# Patient Record
Sex: Male | Born: 1965 | Race: White | Hispanic: No | Marital: Married | Smoking: Never smoker
Health system: Southern US, Community
[De-identification: ages and names within clinical notes are randomized; demographics above are authoritative.]

## PROBLEM LIST (undated history)

## (undated) DIAGNOSIS — Z8679 Personal history of other diseases of the circulatory system: Secondary | ICD-10-CM

## (undated) DIAGNOSIS — T7840XA Allergy, unspecified, initial encounter: Secondary | ICD-10-CM

## (undated) DIAGNOSIS — J302 Other seasonal allergic rhinitis: Secondary | ICD-10-CM

## (undated) DIAGNOSIS — E785 Hyperlipidemia, unspecified: Secondary | ICD-10-CM

## (undated) DIAGNOSIS — I10 Essential (primary) hypertension: Secondary | ICD-10-CM

## (undated) DIAGNOSIS — F419 Anxiety disorder, unspecified: Secondary | ICD-10-CM

## (undated) HISTORY — DX: Hyperlipidemia, unspecified: E78.5

## (undated) HISTORY — DX: Essential (primary) hypertension: I10

## (undated) HISTORY — PX: OTHER SURGICAL HISTORY: SHX169

## (undated) HISTORY — DX: Personal history of other diseases of the circulatory system: Z86.79

## (undated) HISTORY — DX: Allergy, unspecified, initial encounter: T78.40XA

## (undated) HISTORY — DX: Anxiety disorder, unspecified: F41.9

## (undated) HISTORY — DX: Other seasonal allergic rhinitis: J30.2

## (undated) HISTORY — PX: WISDOM TOOTH EXTRACTION: SHX21

---
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
## 2001-02-15 ENCOUNTER — Encounter: Payer: Self-pay | Admitting: Family Medicine

## 2001-02-15 ENCOUNTER — Ambulatory Visit (HOSPITAL_COMMUNITY): Admission: RE | Admit: 2001-02-15 | Discharge: 2001-02-15 | Payer: Self-pay | Admitting: Family Medicine

## 2004-02-07 ENCOUNTER — Ambulatory Visit (HOSPITAL_COMMUNITY): Admission: RE | Admit: 2004-02-07 | Discharge: 2004-02-07 | Payer: Self-pay | Admitting: Family Medicine

## 2010-06-28 ENCOUNTER — Encounter: Payer: Self-pay | Admitting: Nurse Practitioner

## 2010-06-28 DIAGNOSIS — I10 Essential (primary) hypertension: Secondary | ICD-10-CM

## 2012-06-22 ENCOUNTER — Encounter: Payer: Self-pay | Admitting: *Deleted

## 2012-06-22 NOTE — Telephone Encounter (Signed)
This encounter was created in error - please disregard.

## 2012-06-23 ENCOUNTER — Other Ambulatory Visit: Payer: Self-pay | Admitting: Nurse Practitioner

## 2012-06-23 ENCOUNTER — Other Ambulatory Visit: Payer: Self-pay | Admitting: *Deleted

## 2012-06-23 DIAGNOSIS — E785 Hyperlipidemia, unspecified: Secondary | ICD-10-CM

## 2012-06-23 MED ORDER — ATORVASTATIN CALCIUM 20 MG PO TABS: 20.0000 mg | ORAL_TABLET | Freq: Every day | ORAL | Status: DC

## 2012-12-30 ENCOUNTER — Other Ambulatory Visit: Payer: Self-pay

## 2012-12-30 MED ORDER — VALSARTAN-HYDROCHLOROTHIAZIDE 160-12.5 MG PO TABS: 1.0000 | ORAL_TABLET | Freq: Every day | ORAL | Status: DC

## 2012-12-30 MED ORDER — METOPROLOL SUCCINATE ER 200 MG PO TB24: 200.0000 mg | ORAL_TABLET | Freq: Every day | ORAL | Status: DC

## 2012-12-30 NOTE — Telephone Encounter (Signed)
Last seen 05/13/11  [facility]

## 2012-12-30 NOTE — Telephone Encounter (Signed)
Patient needs to be seen. Has exceeded time since last visit. Needs to bring all medications to next appointment. Last refill. 

## 2012-12-31 ENCOUNTER — Other Ambulatory Visit: Payer: Self-pay

## 2012-12-31 MED ORDER — METOPROLOL SUCCINATE ER 200 MG PO TB24: 200.0000 mg | ORAL_TABLET | Freq: Every day | ORAL | Status: DC

## 2012-12-31 MED ORDER — VALSARTAN-HYDROCHLOROTHIAZIDE 160-12.5 MG PO TABS: 1.0000 | ORAL_TABLET | Freq: Every day | ORAL | Status: DC

## 2012-12-31 NOTE — Telephone Encounter (Signed)
rx called to [facility] 

## 2013-03-22 ENCOUNTER — Encounter (INDEPENDENT_AMBULATORY_CARE_PROVIDER_SITE_OTHER): Payer: Self-pay

## 2013-03-22 ENCOUNTER — Ambulatory Visit (INDEPENDENT_AMBULATORY_CARE_PROVIDER_SITE_OTHER): Payer: BC Managed Care – PPO | Admitting: General Practice

## 2013-03-22 ENCOUNTER — Telehealth: Payer: Self-pay | Admitting: Family Medicine

## 2013-03-22 ENCOUNTER — Encounter: Payer: Self-pay | Admitting: General Practice

## 2013-03-22 VITALS — BP 153/90 | HR 66 | Temp 98.5°F | Ht 70.0 in | Wt 247.0 lb

## 2013-03-22 DIAGNOSIS — H9209 Otalgia, unspecified ear: Secondary | ICD-10-CM

## 2013-03-22 DIAGNOSIS — H9202 Otalgia, left ear: Secondary | ICD-10-CM

## 2013-03-22 MED ORDER — NEOMYCIN-POLYMYXIN-HC 3.5-10000-1 OT SOLN: 3.0000 [drp] | Freq: Three times a day (TID) | OTIC | Status: AC

## 2013-03-22 NOTE — Patient Instructions (Signed)
Otalgia °The most common reason for this in children is an infection of the middle ear. Pain from the middle ear is usually caused by a build-up of fluid and pressure behind the eardrum. Pain from an earache can be sharp, dull, or burning. The pain may be temporary or constant. The middle ear is connected to the nasal passages by a short narrow tube called the Eustachian tube. The Eustachian tube allows fluid to drain out of the middle ear, and helps keep the pressure in your ear equalized. °CAUSES  °A cold or allergy can block the Eustachian tube with inflammation and the build-up of secretions. This is especially likely in small children, because their Eustachian tube is shorter and more horizontal. When the Eustachian tube closes, the normal flow of fluid from the middle ear is stopped. Fluid can accumulate and cause stuffiness, pain, hearing loss, and an ear infection if germs start growing in this area. °SYMPTOMS  °The symptoms of an ear infection may include fever, ear pain, fussiness, increased crying, and irritability. Many children will have temporary and minor hearing loss during and right after an ear infection. Permanent hearing loss is rare, but the risk increases the more infections a child has. Other causes of ear pain include retained water in the outer ear canal from swimming and bathing. °Ear pain in adults is less likely to be from an ear infection. Ear pain may be referred from other locations. Referred pain may be from the joint between your jaw and the skull. It may also come from a tooth problem or problems in the neck. Other causes of ear pain include: °· A foreign body in the ear. °· Outer ear infection. °· Sinus infections. °· Impacted ear wax. °· Ear injury. °· Arthritis of the jaw or TMJ problems. °· Middle ear infection. °· Tooth infections. °· Sore throat with pain to the ears. °DIAGNOSIS  °Your caregiver can usually make the diagnosis by examining you. Sometimes other special studies,  including x-rays and lab work may be necessary. °TREATMENT  °· If antibiotics were prescribed, use them as directed and finish them even if you or your child's symptoms seem to be improved. °· Sometimes PE tubes are needed in children. These are little plastic tubes which are put into the eardrum during a simple surgical procedure. They allow fluid to drain easier and allow the pressure in the middle ear to equalize. This helps relieve the ear pain caused by pressure changes. °HOME CARE INSTRUCTIONS  °· Only take over-the-counter or prescription medicines for pain, discomfort, or fever as directed by your caregiver. DO NOT GIVE CHILDREN ASPIRIN because of the association of Reye's Syndrome in children taking aspirin. °· Use a cold pack applied to the outer ear for 15-20 minutes, 03-04 times per day or as needed may reduce pain. Do not apply ice directly to the skin. You may cause frost bite. °· Over-the-counter ear drops used as directed may be effective. Your caregiver may sometimes prescribe ear drops. °· Resting in an upright position may help reduce pressure in the middle ear and relieve pain. °· Ear pain caused by rapidly descending from high altitudes can be relieved by swallowing or chewing gum. Allowing infants to suck on a bottle during airplane travel can help. °· Do not smoke in the house or near children. If you are unable to quit smoking, smoke outside. °· Control allergies. °SEEK IMMEDIATE MEDICAL CARE IF:  °· You or your child are becoming sicker. °· Pain or fever   relief is not obtained with medicine. °· You or your child's symptoms (pain, fever, or irritability) do not improve within 24 to 48 hours or as instructed. °· Severe pain suddenly stops hurting. This may indicate a ruptured eardrum. °· You or your children develop new problems such as severe headaches, stiff neck, difficulty swallowing, or swelling of the face or around the ear. °Document Released: 11/09/2003 Document Revised: 06/16/2011  Document Reviewed: 03/15/2008 °ExitCare® Patient Information ©2014 [employer]. ° °

## 2013-03-22 NOTE — Progress Notes (Signed)
   Subjective:    Patient ID: Benjamin Edwards, male    DOB: 20-Jan-1966, 47 y.o.   MRN: [id number]  Otalgia  There is pain in the left ear. This is a new problem. The current episode started in the past 7 days. The problem occurs every few minutes. The problem has been unchanged. There has been no fever. The pain is at a severity of 4/10. Pertinent negatives include no abdominal pain, coughing, diarrhea, drainage, ear discharge, headaches, hearing loss, neck pain, rash, rhinorrhea, sore throat or vomiting. He has tried nothing for the symptoms.  Reports a dental filling of left upper tooth and pain with cold air periodically. Reports he will follow up with dentist.     Review of Systems  Constitutional: Negative for fever and chills.  HENT: Positive for ear pain. Negative for congestion, ear discharge, hearing loss, postnasal drip, rhinorrhea and sore throat.        Left ear pain   Respiratory: Negative for cough and chest tightness.   Cardiovascular: Negative for chest pain and palpitations.  Gastrointestinal: Negative for vomiting, abdominal pain and diarrhea.  Musculoskeletal: Negative for neck pain.  Skin: Negative for rash.  Neurological: Negative for dizziness, weakness and headaches.       Objective:   Physical Exam  Constitutional: He is oriented to person, place, and time. He appears well-developed and well-nourished.  HENT:  Head: Normocephalic and atraumatic.  Right Ear: External ear normal.  Left Ear: External ear normal.  Nose: Nose normal.  Mouth/Throat: Oropharynx is clear and moist. Dental caries present.  Eyes: Pupils are equal, round, and reactive to light.  Neck: Normal range of motion. Neck supple. No thyromegaly present.  Cardiovascular: Normal rate, regular rhythm and normal heart sounds.   Pulmonary/Chest: Effort normal and breath sounds normal. No respiratory distress. He exhibits no tenderness.  Lymphadenopathy:    He has no cervical adenopathy.    Neurological: He is alert and oriented to person, place, and time.  Skin: Skin is warm and dry.  Psychiatric: He has a normal mood and affect.          Assessment & Plan:  1. Left ear pain - neomycin-polymyxin-hydrocortisone (CORTISPORIN) otic solution; Place 3 drops into the left ear 3 (three) times daily.  Dispense: 10 mL; Refill: 1 -refrain from cleaning ear with Q-tips or other objects -RTO prn -follow up with dentist about having tooth repaired -Patient verbalized understanding Coralie Keens, [name]-C

## 2013-03-22 NOTE — Telephone Encounter (Signed)
APPT MADE

## 2013-06-29 ENCOUNTER — Other Ambulatory Visit: Payer: Self-pay | Admitting: Family Medicine

## 2013-07-27 ENCOUNTER — Other Ambulatory Visit: Payer: Self-pay | Admitting: *Deleted

## 2013-07-27 DIAGNOSIS — E785 Hyperlipidemia, unspecified: Secondary | ICD-10-CM

## 2013-07-27 NOTE — Telephone Encounter (Signed)
Last ov 12/14. Last lipids 2/14. ntbs

## 2013-09-27 ENCOUNTER — Other Ambulatory Visit: Payer: Self-pay | Admitting: Family Medicine

## 2013-09-28 NOTE — Telephone Encounter (Signed)
No labs in Epic.

## 2013-09-28 NOTE — Telephone Encounter (Signed)
Patient NTBS for follow up and lab work  

## 2013-10-06 ENCOUNTER — Ambulatory Visit (INDEPENDENT_AMBULATORY_CARE_PROVIDER_SITE_OTHER): Payer: BC Managed Care – PPO | Admitting: Family Medicine

## 2013-10-06 ENCOUNTER — Encounter: Payer: Self-pay | Admitting: Family Medicine

## 2013-10-06 VITALS — BP 138/82 | HR 71 | Temp 98.0°F | Ht 70.0 in | Wt 246.4 lb

## 2013-10-06 DIAGNOSIS — I10 Essential (primary) hypertension: Secondary | ICD-10-CM

## 2013-10-06 NOTE — Progress Notes (Signed)
   Subjective:    Patient ID: Marti SleighMichael Biskup, male    DOB: 04-Dec-1965, 48 y.o.   MRN: [id number]  HPI This 48 y.o. male presents for evaluation of needing Dmv form filled out.  He has hx of hypertension.   Review of Systems No chest pain, SOB, HA, dizziness, vision change, N/V, diarrhea, constipation, dysuria, urinary urgency or frequency, myalgias, arthralgias or rash.     Objective:   Physical Exam Vital signs noted  Well developed well nourished male.  HEENT - Head atraumatic Normocephalic                Eyes - PERRLA, Conjuctiva - clear Sclera- Clear EOMI                Ears - EAC's Wnl TM's Wnl Gross Hearing WNL                Nose - Nares patent                 Throat - oropharanx wnl Respiratory - Lungs CTA bilateral Cardiac - RRR S1 and S2 without murmur GI - Abdomen soft Nontender and bowel sounds active x 4 Extremities - No edema. Neuro - Grossly intact.       Assessment & Plan:  HTN - Controlled and continue current meds.  DMV form filled out.  Deatra CanterWilliam J Oxford [name]

## 2013-10-31 ENCOUNTER — Other Ambulatory Visit: Payer: Self-pay | Admitting: Nurse Practitioner

## 2013-11-26 ENCOUNTER — Other Ambulatory Visit: Payer: Self-pay | Admitting: Nurse Practitioner

## 2014-02-27 ENCOUNTER — Other Ambulatory Visit: Payer: Self-pay | Admitting: Nurse Practitioner

## 2014-02-28 NOTE — Telephone Encounter (Signed)
Last ov 10/06/13

## 2014-02-28 NOTE — Telephone Encounter (Signed)
Patient NTBS for follow up and lab work  

## 2014-03-25 ENCOUNTER — Other Ambulatory Visit: Payer: Self-pay | Admitting: Nurse Practitioner

## 2014-04-24 ENCOUNTER — Telehealth: Payer: Self-pay | Admitting: Family Medicine

## 2014-04-24 MED ORDER — METOPROLOL SUCCINATE ER 200 MG PO TB24: 200.0000 mg | ORAL_TABLET | Freq: Every day | ORAL | Status: DC

## 2014-04-24 MED ORDER — VALSARTAN-HYDROCHLOROTHIAZIDE 160-12.5 MG PO TABS: 1.0000 | ORAL_TABLET | Freq: Every day | ORAL | Status: DC

## 2014-04-24 NOTE — Telephone Encounter (Signed)
done

## 2014-04-28 ENCOUNTER — Ambulatory Visit: Payer: Self-pay | Admitting: Family Medicine

## 2014-05-03 ENCOUNTER — Ambulatory Visit (INDEPENDENT_AMBULATORY_CARE_PROVIDER_SITE_OTHER): Payer: BLUE CROSS/BLUE SHIELD | Admitting: Family Medicine

## 2014-05-03 VITALS — BP 146/94 | HR 65 | Temp 97.6°F | Ht 70.0 in | Wt 242.6 lb

## 2014-05-03 DIAGNOSIS — I10 Essential (primary) hypertension: Secondary | ICD-10-CM

## 2014-05-03 DIAGNOSIS — E785 Hyperlipidemia, unspecified: Secondary | ICD-10-CM

## 2014-05-03 DIAGNOSIS — Z0000 Encounter for general adult medical examination without abnormal findings: Secondary | ICD-10-CM

## 2014-05-03 DIAGNOSIS — Z23 Encounter for immunization: Secondary | ICD-10-CM

## 2014-05-03 MED ORDER — METOPROLOL SUCCINATE ER 200 MG PO TB24: 200.0000 mg | ORAL_TABLET | Freq: Every day | ORAL | Status: DC

## 2014-05-03 MED ORDER — VALSARTAN-HYDROCHLOROTHIAZIDE 320-12.5 MG PO TABS: 1.0000 | ORAL_TABLET | Freq: Every day | ORAL | Status: DC

## 2014-05-03 MED ORDER — ATORVASTATIN CALCIUM 20 MG PO TABS: 20.0000 mg | ORAL_TABLET | Freq: Every day | ORAL | Status: DC

## 2014-05-03 NOTE — Addendum Note (Signed)
Addended by: Fawn KirkHOLT, CATHY on: 05/03/2014 02:30 PM   Modules accepted: Orders, SmartSet

## 2014-05-03 NOTE — Progress Notes (Signed)
   Subjective:    Patient ID: Benjamin Edwards, male    DOB: [birth year], 49 y.o.   MRN: [id number]  HPI Patient is here for follow up.  He is needing refills on lipid and bp meds.  He is asking for flu shot.  He c/o anxiety on occasion.  He denies any depression.   Review of Systems  Constitutional: Negative for fever.  HENT: Negative for ear pain.   Eyes: Negative for discharge.  Respiratory: Negative for cough.   Cardiovascular: Negative for chest pain.  Gastrointestinal: Negative for abdominal distention.  Endocrine: Negative for polyuria.  Genitourinary: Negative for difficulty urinating.  Musculoskeletal: Negative for gait problem and neck pain.  Skin: Negative for color change and rash.  Neurological: Negative for speech difficulty and headaches.  Psychiatric/Behavioral: Negative for agitation.       Objective:    BP 146/94 mmHg  Pulse 65  Temp(Src) 97.6 F (36.4 C) (Oral)  Ht _0  (1.778 m)  Wt 242 lb 9.6 oz (110.043 kg)  BMI 34.81 kg/m2 Physical Exam  Constitutional: He is oriented to person, place, and time. He appears well-developed and well-nourished.  HENT:  Head: Normocephalic and atraumatic.  Mouth/Throat: Oropharynx is clear and moist.  Eyes: Pupils are equal, round, and reactive to light.  Neck: Normal range of motion. Neck supple.  Cardiovascular: Normal rate and regular rhythm.   No murmur heard. Pulmonary/Chest: Effort normal and breath sounds normal.  Abdominal: Soft. Bowel sounds are normal. There is no tenderness.  Neurological: He is alert and oriented to person, place, and time.  Skin: Skin is warm and dry.  Psychiatric: He has a normal mood and affect.          Assessment & Plan:     ICD-9-CM ICD-10-CM   1. Hyperlipidemia 272.4 E78.5 atorvastatin (LIPITOR) 20 MG tablet  2. Essential hypertension 401.9 I10 metoprolol (TOPROL-XL) 200 MG 24 hr tablet     valsartan-hydrochlorothiazide (DIOVAN HCT) 320-12.5 MG per tablet  3. Routine general  medical examination at a health care facility V70.0 Z00.00 POCT CBC     Lipid panel     PSA, total and free     TSH     CMP14+EGFR   4.  Anxiety - Discussed going on sertraline and he wants to do self help.  Follow up prn.  No Follow-up on file.  Lysbeth Penner [name]

## 2014-05-03 NOTE — Addendum Note (Signed)
Addended by: Tommas OlpHANDY, Lakeasha Petion N on: 05/03/2014 01:28 PM   Modules accepted: Orders

## 2014-05-04 LAB — CMP14+EGFR
ALT: 21 IU/L (ref 0–44)
AST: 25 IU/L (ref 0–40)
Albumin/Globulin Ratio: 1.8 (ref 1.1–2.5)
Albumin: 4.6 g/dL (ref 3.5–5.5)
Alkaline Phosphatase: 44 IU/L (ref 39–117)
BUN/Creatinine Ratio: 11 (ref 9–20)
BUN: 13 mg/dL (ref 6–24)
CO2: 26 mmol/L (ref 18–29)
Calcium: 9.8 mg/dL (ref 8.7–10.2)
Chloride: 100 mmol/L (ref 97–108)
Creatinine, Ser: 1.14 mg/dL (ref 0.76–1.27)
GFR calc Af Amer: 87 mL/min/{1.73_m2} (ref >59)
GFR calc non Af Amer: 75 mL/min/{1.73_m2} (ref >59)
Globulin, Total: 2.5 g/dL (ref 1.5–4.5)
Glucose: 108 mg/dL — ABNORMAL HIGH (ref 65–99)
Potassium: 4 mmol/L (ref 3.5–5.2)
Sodium: 141 mmol/L (ref 134–144)
Total Bilirubin: 0.4 mg/dL (ref 0.0–1.2)
Total Protein: 7.1 g/dL (ref 6.0–8.5)

## 2014-05-04 LAB — CBC WITH DIFFERENTIAL
Basophils Absolute: 0 10*3/uL (ref 0.0–0.2)
Basos: 0 %
Eos: 3 %
Eosinophils Absolute: 0.2 10*3/uL (ref 0.0–0.4)
HCT: 42.5 % (ref 37.5–51.0)
Hemoglobin: 14.7 g/dL (ref 12.6–17.7)
Immature Grans (Abs): 0 10*3/uL (ref 0.0–0.1)
Immature Granulocytes: 0 %
Lymphocytes Absolute: 2 10*3/uL (ref 0.7–3.1)
Lymphs: 39 %
MCH: 27.3 pg (ref 26.6–33.0)
MCHC: 34.6 g/dL (ref 31.5–35.7)
MCV: 79 fL (ref 79–97)
Monocytes Absolute: 0.5 10*3/uL (ref 0.1–0.9)
Monocytes: 9 %
Neutrophils Absolute: 2.4 10*3/uL (ref 1.4–7.0)
Neutrophils Relative %: 49 %
RBC: 5.39 x10E6/uL (ref 4.14–5.80)
RDW: 15.1 % (ref 12.3–15.4)
WBC: 5 10*3/uL (ref 3.4–10.8)

## 2014-05-04 LAB — LIPID PANEL
Chol/HDL Ratio: 7.5 ratio units — ABNORMAL HIGH (ref 0.0–5.0)
Cholesterol, Total: 254 mg/dL — ABNORMAL HIGH (ref 100–199)
HDL: 34 mg/dL — ABNORMAL LOW (ref >39)
LDL Calculated: 179 mg/dL — ABNORMAL HIGH (ref 0–99)
Triglycerides: 207 mg/dL — ABNORMAL HIGH (ref 0–149)
VLDL Cholesterol Cal: 41 mg/dL — ABNORMAL HIGH (ref 5–40)

## 2014-05-04 LAB — PSA, TOTAL AND FREE
PSA, Free Pct: 30 %
PSA, Free: 0.18 ng/mL
PSA: 0.6 ng/mL (ref 0.0–4.0)

## 2014-05-04 LAB — SPECIMEN STATUS REPORT

## 2014-05-04 LAB — TSH: TSH: 1.23 u[IU]/mL (ref 0.450–4.500)

## 2014-05-04 LAB — PLATELET COUNT: Platelets: 216 10*3/uL (ref 150–379)

## 2014-05-05 ENCOUNTER — Other Ambulatory Visit: Payer: Self-pay | Admitting: Family Medicine

## 2014-05-05 MED ORDER — SERTRALINE HCL 50 MG PO TABS: 50.0000 mg | ORAL_TABLET | Freq: Every day | ORAL | Status: DC

## 2014-07-20 ENCOUNTER — Other Ambulatory Visit: Payer: Self-pay | Admitting: Family Medicine

## 2014-10-18 ENCOUNTER — Other Ambulatory Visit: Payer: Self-pay | Admitting: Family Medicine

## 2014-10-20 ENCOUNTER — Encounter: Payer: Self-pay | Admitting: Physician Assistant

## 2014-10-20 ENCOUNTER — Ambulatory Visit (INDEPENDENT_AMBULATORY_CARE_PROVIDER_SITE_OTHER): Payer: BLUE CROSS/BLUE SHIELD | Admitting: Physician Assistant

## 2014-10-20 VITALS — BP 157/95 | HR 65 | Temp 97.5°F | Ht 70.0 in | Wt 242.0 lb

## 2014-10-20 DIAGNOSIS — M79675 Pain in left toe(s): Secondary | ICD-10-CM

## 2014-10-20 MED ORDER — INDOMETHACIN 50 MG PO CAPS: 50.0000 mg | ORAL_CAPSULE | Freq: Three times a day (TID) | ORAL | Status: DC | PRN

## 2014-10-20 NOTE — Patient Instructions (Signed)

## 2014-10-20 NOTE — Progress Notes (Signed)
Subjective:     Patient ID: Benjamin Edwards, male   DOB: 08-15-1965, 49 y.o.   MRN: [id number]  HPI Pt here with pain and redness to the prox L great toe Denies any injury to the area No hx of same It hurt to even have a sheet on the area Sx have improved some  Review of Systems     Objective:   Physical Exam Sl erythema and edema to the prox L great toe + TTP of same Fairly good ROM Good sensory and cap rf No edema to the foot    Assessment:     1. Pain of toe of left foot        Plan:     Discussed possible gout vs psoriatic arthritis Indocin 50mg  tid prn #30 Stressed importance of hydration Also discussed prev meds He will do research and return to us Will inform of lab results

## 2014-10-21 LAB — CMP14+EGFR
ALT: 24 IU/L (ref 0–44)
AST: 20 IU/L (ref 0–40)
Albumin/Globulin Ratio: 1.8 (ref 1.1–2.5)
Albumin: 4.3 g/dL (ref 3.5–5.5)
Alkaline Phosphatase: 60 IU/L (ref 39–117)
BUN/Creatinine Ratio: 12 (ref 9–20)
BUN: 14 mg/dL (ref 6–24)
Bilirubin Total: 0.5 mg/dL (ref 0.0–1.2)
CO2: 26 mmol/L (ref 18–29)
Calcium: 9 mg/dL (ref 8.7–10.2)
Chloride: 97 mmol/L (ref 97–108)
Creatinine, Ser: 1.2 mg/dL (ref 0.76–1.27)
GFR calc Af Amer: 81 mL/min/{1.73_m2} (ref >59)
GFR calc non Af Amer: 70 mL/min/{1.73_m2} (ref >59)
Globulin, Total: 2.4 g/dL (ref 1.5–4.5)
Glucose: 136 mg/dL — ABNORMAL HIGH (ref 65–99)
Potassium: 3.6 mmol/L (ref 3.5–5.2)
SODIUM: 141 mmol/L (ref 134–144)
TOTAL PROTEIN: 6.7 g/dL (ref 6.0–8.5)

## 2014-10-21 LAB — URIC ACID: URIC ACID: 9.2 mg/dL — AB (ref 3.7–8.6)

## 2015-01-15 ENCOUNTER — Other Ambulatory Visit: Payer: Self-pay | Admitting: Nurse Practitioner

## 2015-02-06 ENCOUNTER — Telehealth: Payer: Self-pay | Admitting: Family Medicine

## 2015-04-30 ENCOUNTER — Other Ambulatory Visit: Payer: Self-pay | Admitting: Nurse Practitioner

## 2015-04-30 ENCOUNTER — Other Ambulatory Visit: Payer: Self-pay | Admitting: Family Medicine

## 2015-04-30 NOTE — Telephone Encounter (Signed)
Last seen 04/26/14

## 2015-05-01 NOTE — Telephone Encounter (Signed)
Patient aware.

## 2015-05-01 NOTE — Telephone Encounter (Signed)
Patient aware and will set up an appointment

## 2015-05-01 NOTE — Telephone Encounter (Signed)
Last refill without being seen 

## 2015-05-01 NOTE — Telephone Encounter (Signed)
zoloft refilled lipitor denied- Patient NTBS for follow up and lab work

## 2015-05-04 ENCOUNTER — Encounter: Payer: Self-pay | Admitting: Nurse Practitioner

## 2015-05-04 ENCOUNTER — Ambulatory Visit (INDEPENDENT_AMBULATORY_CARE_PROVIDER_SITE_OTHER): Payer: BLUE CROSS/BLUE SHIELD | Admitting: Nurse Practitioner

## 2015-05-04 ENCOUNTER — Ambulatory Visit (INDEPENDENT_AMBULATORY_CARE_PROVIDER_SITE_OTHER): Payer: BLUE CROSS/BLUE SHIELD

## 2015-05-04 VITALS — BP 176/107 | HR 68 | Temp 97.4°F | Ht 70.0 in | Wt 256.0 lb

## 2015-05-04 DIAGNOSIS — I10 Essential (primary) hypertension: Secondary | ICD-10-CM

## 2015-05-04 DIAGNOSIS — F329 Major depressive disorder, single episode, unspecified: Secondary | ICD-10-CM

## 2015-05-04 DIAGNOSIS — E785 Hyperlipidemia, unspecified: Secondary | ICD-10-CM

## 2015-05-04 DIAGNOSIS — E669 Obesity, unspecified: Secondary | ICD-10-CM | POA: Insufficient documentation

## 2015-05-04 DIAGNOSIS — F32A Depression, unspecified: Secondary | ICD-10-CM

## 2015-05-04 MED ORDER — METOPROLOL SUCCINATE ER 200 MG PO TB24: 200.0000 mg | ORAL_TABLET | Freq: Every day | ORAL | Status: DC

## 2015-05-04 MED ORDER — ATORVASTATIN CALCIUM 20 MG PO TABS: 20.0000 mg | ORAL_TABLET | Freq: Every day | ORAL | Status: DC

## 2015-05-04 MED ORDER — AMLODIPINE-VALSARTAN-HCTZ 10-320-25 MG PO TABS: 1.0000 | ORAL_TABLET | Freq: Every day | ORAL | Status: DC

## 2015-05-04 MED ORDER — SERTRALINE HCL 50 MG PO TABS: ORAL_TABLET | ORAL | Status: DC

## 2015-05-04 NOTE — Patient Instructions (Signed)

## 2015-05-04 NOTE — Progress Notes (Signed)
Subjective:    Patient ID: Benjamin Edwards, male    DOB: 11-11-1965, 50 y.o.   MRN: [id number]   Patient here today for follow up of chronic medical problems.  Outpatient Encounter Prescriptions as of 05/04/2015  Medication Sig  . aspirin 81 MG tablet Take 81 mg by mouth daily.    Marland Kitchen atorvastatin (LIPITOR) 20 MG tablet Take 1 tablet (20 mg total) by mouth daily.  . metoprolol (TOPROL-XL) 200 MG 24 hr tablet Take 1 tablet (200 mg total) by mouth daily.  . sertraline (ZOLOFT) 50 MG tablet TAKE 1 TABLET (50 MG TOTAL) BY MOUTH DAILY.  . valsartan-hydrochlorothiazide (DIOVAN-HCT) 160-12.5 MG tablet TAKE 1 TABLET BY MOUTH DAILY.  . [DISCONTINUED] indomethacin (INDOCIN) 50 MG capsule Take 1 capsule (50 mg total) by mouth 3 (three) times daily as needed.  . [DISCONTINUED] valsartan-hydrochlorothiazide (DIOVAN HCT) 320-12.5 MG per tablet Take 1 tablet by mouth daily. (Patient not taking: Reported on 05/04/2015)   No facility-administered encounter medications on file as of 05/04/2015.     Hypertension This is a chronic problem. The current episode started more than 1 year ago. The problem is uncontrolled. Risk factors for coronary artery disease include dyslipidemia and obesity. Past treatments include angiotensin blockers and diuretics. Compliance problems: patient was given rx for diovan 320/12.5 but when went to get  refilled a month later they gave him hei 160/12.5 mg dose- so that is what he has been taking.  There is no history of CAD/MI.  Hyperlipidemia This is a chronic problem. The current episode started more than 1 year ago. Recent lipid tests were reviewed and are variable. Exacerbating diseases include obesity. He has no history of hypothyroidism. There are no known factors aggravating his hyperlipidemia. Current antihyperlipidemic treatment includes statins. The current treatment provides moderate improvement of lipids. Compliance problems include adherence to diet and adherence to  exercise.  Risk factors for coronary artery disease include dyslipidemia, male sex and obesity.  depression zoloft 29m daily- working well- no side effects  Review of Systems  Constitutional: Negative.   HENT: Negative.   Respiratory: Negative.   Cardiovascular: Negative.   Genitourinary: Negative.   Neurological: Negative.   Psychiatric/Behavioral: Negative.   All other systems reviewed and are negative.      Objective:   Physical Exam  Constitutional: He is oriented to person, place, and time. He appears well-developed and well-nourished.  HENT:  Head: Normocephalic.  Right Ear: External ear normal.  Left Ear: External ear normal.  Nose: Nose normal.  Mouth/Throat: Oropharynx is clear and moist.  Eyes: EOM are normal. Pupils are equal, round, and reactive to light.  Neck: Normal range of motion. Neck supple. No JVD present. No thyromegaly present.  Cardiovascular: Normal rate, regular rhythm, normal heart sounds and intact distal pulses.  Exam reveals no gallop and no friction rub.   No murmur heard. Pulmonary/Chest: Effort normal and breath sounds normal. No respiratory distress. He has no wheezes. He has no rales. He exhibits no tenderness.  Abdominal: Soft. Bowel sounds are normal. He exhibits no mass. There is no tenderness.  Genitourinary: Prostate normal and penis normal.  Musculoskeletal: Normal range of motion. He exhibits no edema.  Lymphadenopathy:    He has no cervical adenopathy.  Neurological: He is alert and oriented to person, place, and time. No cranial nerve deficit.  Skin: Skin is warm and dry.  Psychiatric: He has a normal mood and affect. His behavior is normal. Judgment and thought content normal.  BP 176/107 mmHg  Pulse 68  Temp(Src) 97.4 F (36.3 C) (Oral)  Ht _0  (1.778 m)  Wt 256 lb (116.121 kg)  BMI 36.73 kg/m2  EKG- NSR-Mary-Margaret Hassell Done, [name]  CHest x ray- no cardiopulmonary disease-Preliminary reading by Ronnald Collum, [name]   [facility]     Assessment & Plan:  1. Essential hypertension Do ot add slat o diet - metoprolol (TOPROL-XL) 200 MG 24 hr tablet; Take 1 tablet (200 mg total) by mouth daily.  Dispense: 90 tablet; Refill: 3 - Amlodipine-Valsartan-HCTZ 10-320-25 MG TABS; Take 1 tablet by mouth daily.  Dispense: 30 tablet; Refill: 5 - CMP14+EGFR - EKG 12-Lead - DG Chest 2 View; Future  2. Morbid obesity, unspecified obesity type ([place]) Discussed diet and exercise for person with BMI >25 Will recheck weight in 3-6 months  3. Hyperlipidemia with target LDL less than 100 Low fat diet - Lipid panel - atorvastatin (LIPITOR) 20 MG tablet; Take 1 tablet (20 mg total) by mouth daily.  Dispense: 90 tablet; Refill: 3  4. Depression Stress management - sertraline (ZOLOFT) 50 MG tablet; TAKE 1 TABLET (50 MG TOTAL) BY MOUTH DAILY.  Dispense: 30 tablet; Refill: 5    Labs pending Health maintenance reviewed Diet and exercise encouraged Continue all meds Follow up  In 3 months   [place], [name]

## 2015-06-06 ENCOUNTER — Other Ambulatory Visit: Payer: Self-pay

## 2015-06-06 DIAGNOSIS — F329 Major depressive disorder, single episode, unspecified: Secondary | ICD-10-CM

## 2015-06-06 DIAGNOSIS — F32A Depression, unspecified: Secondary | ICD-10-CM

## 2015-06-06 MED ORDER — SERTRALINE HCL 50 MG PO TABS: ORAL_TABLET | ORAL | Status: DC

## 2015-07-15 ENCOUNTER — Other Ambulatory Visit: Payer: Self-pay | Admitting: Nurse Practitioner

## 2015-08-10 ENCOUNTER — Other Ambulatory Visit: Payer: Self-pay | Admitting: Nurse Practitioner

## 2015-09-11 ENCOUNTER — Other Ambulatory Visit: Payer: Self-pay | Admitting: Nurse Practitioner

## 2015-09-11 NOTE — Telephone Encounter (Signed)
Last refill without being seen 

## 2015-09-11 NOTE — Telephone Encounter (Signed)
Last seen 05/04/15  MMM 

## 2015-09-11 NOTE — Telephone Encounter (Signed)
lmtcb to make appt before the month is up

## 2015-10-31 ENCOUNTER — Other Ambulatory Visit: Payer: Self-pay | Admitting: Nurse Practitioner

## 2015-10-31 NOTE — Telephone Encounter (Signed)
Needs to be seen before next refill

## 2015-11-02 ENCOUNTER — Ambulatory Visit (INDEPENDENT_AMBULATORY_CARE_PROVIDER_SITE_OTHER): Payer: BLUE CROSS/BLUE SHIELD | Admitting: Family Medicine

## 2015-11-02 ENCOUNTER — Ambulatory Visit (INDEPENDENT_AMBULATORY_CARE_PROVIDER_SITE_OTHER): Payer: BLUE CROSS/BLUE SHIELD

## 2015-11-02 ENCOUNTER — Encounter: Payer: Self-pay | Admitting: Family Medicine

## 2015-11-02 VITALS — BP 125/74 | HR 74 | Temp 97.6°F | Ht 70.0 in | Wt 253.4 lb

## 2015-11-02 DIAGNOSIS — I10 Essential (primary) hypertension: Secondary | ICD-10-CM | POA: Diagnosis not present

## 2015-11-02 DIAGNOSIS — E785 Hyperlipidemia, unspecified: Secondary | ICD-10-CM | POA: Diagnosis not present

## 2015-11-02 DIAGNOSIS — M79672 Pain in left foot: Secondary | ICD-10-CM

## 2015-11-02 DIAGNOSIS — M25531 Pain in right wrist: Secondary | ICD-10-CM | POA: Diagnosis not present

## 2015-11-02 DIAGNOSIS — S92302A Fracture of unspecified metatarsal bone(s), left foot, initial encounter for closed fracture: Secondary | ICD-10-CM

## 2015-11-02 DIAGNOSIS — Z1211 Encounter for screening for malignant neoplasm of colon: Secondary | ICD-10-CM

## 2015-11-02 DIAGNOSIS — M25532 Pain in left wrist: Secondary | ICD-10-CM | POA: Diagnosis not present

## 2015-11-02 MED ORDER — DICLOFENAC SODIUM 75 MG PO TBEC: 75.0000 mg | DELAYED_RELEASE_TABLET | Freq: Two times a day (BID) | ORAL | 2 refills | Status: DC

## 2015-11-02 MED ORDER — AMLODIPINE-VALSARTAN-HCTZ 10-320-25 MG PO TABS: 1.0000 | ORAL_TABLET | Freq: Every day | ORAL | 5 refills | Status: DC

## 2015-11-02 MED ORDER — MIRTAZAPINE 30 MG PO TABS: 30.0000 mg | ORAL_TABLET | Freq: Every day | ORAL | 2 refills | Status: DC

## 2015-11-02 MED ORDER — METOPROLOL SUCCINATE ER 200 MG PO TB24: 200.0000 mg | ORAL_TABLET | Freq: Every day | ORAL | 3 refills | Status: DC

## 2015-11-02 MED ORDER — ATORVASTATIN CALCIUM 20 MG PO TABS: 20.0000 mg | ORAL_TABLET | Freq: Every day | ORAL | 3 refills | Status: DC

## 2015-11-02 NOTE — Patient Instructions (Signed)
Insomnia Insomnia is a sleep disorder that makes it difficult to fall asleep or to stay asleep. Insomnia can cause tiredness (fatigue), low energy, difficulty concentrating, mood swings, and poor performance at work or school.  There are three different ways to classify insomnia:  Difficulty falling asleep.  Difficulty staying asleep.  Waking up too early in the morning. Any type of insomnia can be long-term (chronic) or short-term (acute). Both are common. Short-term insomnia usually lasts for three months or less. Chronic insomnia occurs at least three times a week for longer than three months. CAUSES  Insomnia may be caused by another condition, situation, or substance, such as:  Anxiety.  Certain medicines.  Gastroesophageal reflux disease (GERD) or other gastrointestinal conditions.  Asthma or other breathing conditions.  Restless legs syndrome, sleep apnea, or other sleep disorders.  Chronic pain.  Menopause. This may include hot flashes.  Stroke.  Abuse of alcohol, tobacco, or illegal drugs.  Depression.  Caffeine.   Neurological disorders, such as Alzheimer disease.  An overactive thyroid (hyperthyroidism). The cause of insomnia may not be known. RISK FACTORS Risk factors for insomnia include:  Gender. Women are more commonly affected than men.  Age. Insomnia is more common as you get older.  Stress. This may involve your professional or personal life.  Income. Insomnia is more common in people with lower income.  Lack of exercise.   Irregular work schedule or night shifts.  Traveling between different time zones. SIGNS AND SYMPTOMS If you have insomnia, trouble falling asleep or trouble staying asleep is the main symptom. This may lead to other symptoms, such as:  Feeling fatigued.  Feeling nervous about going to sleep.  Not feeling rested in the morning.  Having trouble concentrating.  Feeling irritable, anxious, or depressed. TREATMENT   Treatment for insomnia depends on the cause. If your insomnia is caused by an underlying condition, treatment will focus on addressing the condition. Treatment may also include:   Medicines to help you sleep.  Counseling or therapy.  Lifestyle adjustments. HOME CARE INSTRUCTIONS   Take medicines only as directed by your health care provider.  Keep regular sleeping and waking hours. Avoid naps.  Keep a sleep diary to help you and your health care provider figure out what could be causing your insomnia. Include:   When you sleep.  When you wake up during the night.  How well you sleep.   How rested you feel the next day.  Any side effects of medicines you are taking.  What you eat and drink.   Make your bedroom a comfortable place where it is easy to fall asleep:  Put up shades or special blackout curtains to block light from outside.  Use a white noise machine to block noise.  Keep the temperature cool.   Exercise regularly as directed by your health care provider. Avoid exercising right before bedtime.  Use relaxation techniques to manage stress. Ask your health care provider to suggest some techniques that may work well for you. These may include:  Breathing exercises.  Routines to release muscle tension.  Visualizing peaceful scenes.  Cut back on alcohol, caffeinated beverages, and cigarettes, especially close to bedtime. These can disrupt your sleep.  Do not overeat or eat spicy foods right before bedtime. This can lead to digestive discomfort that can make it hard for you to sleep.  Limit screen use before bedtime. This includes:  Watching TV.  Using your smartphone, tablet, and computer.  Stick to a routine. This   can help you fall asleep faster. Try to do a quiet activity, brush your teeth, and go to bed at the same time each night.  Get out of bed if you are still awake after 15 minutes of trying to sleep. Keep the lights down, but try reading or  doing a quiet activity. When you feel sleepy, go back to bed.  Make sure that you drive carefully. Avoid driving if you feel very sleepy.  Keep all follow-up appointments as directed by your health care provider. This is important. SEEK MEDICAL CARE IF:   You are tired throughout the day or have trouble in your daily routine due to sleepiness.  You continue to have sleep problems or your sleep problems get worse. SEEK IMMEDIATE MEDICAL CARE IF:   You have serious thoughts about hurting yourself or someone else.   This information is not intended to replace advice given to you by your health care provider. Make sure you discuss any questions you have with your health care provider.   Document Released: 03/21/2000 Document Revised: 12/13/2014 Document Reviewed: 12/23/2013 Elsevier Interactive Patient Education 2016 [employer]  

## 2015-11-02 NOTE — Progress Notes (Signed)
Subjective:  Patient ID: Benjamin Edwards, male    DOB: [birth year]  Age: 50 y.o. MRN: [id number]  CC: Hypertension (med refill); Depression; Hyperlipidemia; and Foot Pain (Left)   HPI Benjamin Edwards presents for  follow-up of hypertension. Patient has no history of headache chest pain or shortness of breath or recent cough. Patient also denies symptoms of TIA such as numbness weakness lateralizing. Patient checks  blood pressure at home and has not had any elevated readings recently. Patient denies side effects from his medication. States taking it regularly. Patient in for follow-up of elevated cholesterol. Doing well without complaints on current medication. Denies side effects of statin including myalgia and arthralgia and nausea. Also in today for liver function testing. Currently no chest pain, shortness of breath or other cardiovascular related symptoms noted.  Depression screen [facility] 2/9 11/02/2015 05/03/2014 10/06/2013  Decreased Interest 3 3 0  Down, Depressed, Hopeless 1 2 0  PHQ - 2 Score 4 5 0  Altered sleeping 3 2 -  Tired, decreased energy 2 2 -  Change in appetite 0 0 -  Feeling bad or failure about yourself  1 1 -  Trouble concentrating 1 0 -  Moving slowly or fidgety/restless 0 1 -  Suicidal thoughts 0 0 -  PHQ-9 Score 11 11 -  Difficult doing work/chores Not difficult at all - -   Doesn't sleep well. 4-5 hours a night.    History Benjamin Edwards has a past medical history of Hyperlipidemia and Hypertension.   Benjamin Edwards has no past surgical history on file.   His family history includes Heart disease in his father; Hyperlipidemia in his father; Hypertension in his father.Benjamin Edwards reports that Benjamin Edwards has never smoked. His smokeless tobacco use includes Snuff. Benjamin Edwards reports that Benjamin Edwards does not drink alcohol or use drugs.    ROS Review of Systems  Constitutional: Negative for chills, diaphoresis, fever and unexpected weight change.  HENT: Negative for congestion, hearing loss, rhinorrhea and sore  throat.   Eyes: Negative for visual disturbance.  Respiratory: Negative for cough and shortness of breath.   Cardiovascular: Negative for chest pain.  Gastrointestinal: Negative for abdominal pain, constipation and diarrhea.  Genitourinary: Negative for dysuria and flank pain.  Musculoskeletal: Negative for arthralgias and joint swelling.  Skin: Negative for rash.  Neurological: Negative for dizziness and headaches.  Psychiatric/Behavioral: Negative for dysphoric mood and sleep disturbance.    Objective:  BP 125/74 (BP Location: Left Arm, Patient Position: Sitting, Cuff Size: Large)   Pulse 74   Temp 97.6 F (36.4 C) (Oral)   Ht  (1.778 m)   Wt 253 lb 6.4 oz (114.9 kg)   SpO2 96%   BMI 36.36 kg/m   BP Readings from Last 3 Encounters:  11/02/15 125/74  05/04/15 (!) 176/107  10/20/14 (!) 157/95    Wt Readings from Last 3 Encounters:  11/02/15 253 lb 6.4 oz (114.9 kg)  05/04/15 256 lb (116.1 kg)  10/20/14 242 lb (109.8 kg)     Physical Exam  Constitutional: Benjamin Edwards is oriented to person, place, and time. Benjamin Edwards appears well-developed and well-nourished. No distress.  HENT:  Head: Normocephalic and atraumatic.  Right Ear: External ear normal.  Left Ear: External ear normal.  Nose: Nose normal.  Mouth/Throat: Oropharynx is clear and moist.  Eyes: Conjunctivae and EOM are normal. Pupils are equal, round, and reactive to light.  Neck: Normal range of motion. Neck supple. No thyromegaly present.  Cardiovascular: Normal rate, regular rhythm and normal heart sounds.  No murmur heard. Pulmonary/Chest: Effort normal and breath sounds normal. No respiratory distress. Benjamin Edwards has no wheezes. Benjamin Edwards has no rales.  Abdominal: Soft. Bowel sounds are normal. Benjamin Edwards exhibits no distension. There is no tenderness.  Lymphadenopathy:    Benjamin Edwards has no cervical adenopathy.  Neurological: Benjamin Edwards is alert and oriented to person, place, and time. Benjamin Edwards has normal reflexes.  Skin: Skin is warm and dry.  Psychiatric:  Benjamin Edwards has a normal mood and affect. His behavior is normal. Judgment and thought content normal.     Lab Results  Component Value Date   WBC 5.0 05/03/2014   HGB 14.7 05/03/2014   HCT 42.5 05/03/2014   PLT 216 05/03/2014   GLUCOSE 136 (H) 10/20/2014   CHOL 254 (H) 05/03/2014   TRIG 207 (H) 05/03/2014   HDL 34 (L) 05/03/2014   LDLCALC 179 (H) 05/03/2014   ALT 24 10/20/2014   AST 20 10/20/2014   NA 141 10/20/2014   K 3.6 10/20/2014   CL 97 10/20/2014   CREATININE 1.20 10/20/2014   BUN 14 10/20/2014   CO2 26 10/20/2014   TSH 1.230 05/03/2014   PSA 0.6 05/03/2014    Ct Pelvis W Contrast  Result Date: 02/07/2004 Clinical Data: Abdominal pain, mass seen on outside films.  Technique:  Multidetector CT imaging of the abdomen and pelvis was performed following the standard protocol during bolus administration of intravenous contrast.  Contrast:  150 cc Omnipaque 300  Comparison:  Outside plain films from [facility].  ABDOMEN CT WITH CONTRAST  Findings:  The liver, spleen, pancreas, adrenal, and kidneys are unremarkable. Bowel and gallbladder grossly unremarkable. No free fluid, free air, or adenopathy. No mass seen.  IMPRESSION  Normal CT abdomen  PELVIS CT WITH CONTRAST  Findings:  Pelvic structures . No free fluid, free air, or adenopathy. No mass seen.  IMPRESSION  Normal CT pelvis Provider: Marina Gravel  Ct Abdomen W Contrast  Result Date: 02/07/2004 Clinical Data: Abdominal pain, mass seen on outside films.  Technique:  Multidetector CT imaging of the abdomen and pelvis was performed following the standard protocol during bolus administration of intravenous contrast.  Contrast:  150 cc Omnipaque 300  Comparison:  Outside plain films from [facility].  ABDOMEN CT WITH CONTRAST  Findings:  The liver, spleen, pancreas, adrenal, and kidneys are unremarkable. Bowel and gallbladder grossly unremarkable. No free fluid, free air, or adenopathy. No mass seen.  IMPRESSION   Normal CT abdomen  PELVIS CT WITH CONTRAST  Findings:  Pelvic structures . No free fluid, free air, or adenopathy. No mass seen.  IMPRESSION  Normal CT pelvis Provider: Marina Gravel   Assessment & Plan:   Benjamin Edwards was seen today for hypertension, depression, hyperlipidemia and foot pain.  Diagnoses and all orders for this visit:  Left foot pain -     DG Foot Complete Left  Essential hypertension -     Amlodipine-Valsartan-HCTZ 10-320-25 MG TABS; Take 1 tablet by mouth daily. -     metoprolol (TOPROL-XL) 200 MG 24 hr tablet; Take 1 tablet (200 mg total) by mouth daily.  Hyperlipidemia -     atorvastatin (LIPITOR) 20 MG tablet; Take 1 tablet (20 mg total) by mouth daily.  Screen for colon cancer -     Ambulatory referral to Gastroenterology  Pain in both wrists -     Nerve conduction test; Future  Other orders -     mirtazapine (REMERON) 30 MG tablet; Take 1 tablet (30 mg total) by  mouth at bedtime. -     diclofenac (VOLTAREN) 75 MG EC tablet; Take 1 tablet (75 mg total) by mouth 2 (two) times daily. For muscle and  Joint pain      I have discontinued Benjamin Edwards sertraline. I am also having him start on mirtazapine and diclofenac. Additionally, I am having him maintain his aspirin, Amlodipine-Valsartan-HCTZ, atorvastatin, and metoprolol.  Meds ordered this encounter  Medications  . Amlodipine-Valsartan-HCTZ 10-320-25 MG TABS    Sig: Take 1 tablet by mouth daily.    Dispense:  30 tablet    Refill:  5  . atorvastatin (LIPITOR) 20 MG tablet    Sig: Take 1 tablet (20 mg total) by mouth daily.    Dispense:  90 tablet    Refill:  3  . metoprolol (TOPROL-XL) 200 MG 24 hr tablet    Sig: Take 1 tablet (200 mg total) by mouth daily.    Dispense:  90 tablet    Refill:  3  . mirtazapine (REMERON) 30 MG tablet    Sig: Take 1 tablet (30 mg total) by mouth at bedtime.    Dispense:  30 tablet    Refill:  2  . diclofenac (VOLTAREN) 75 MG EC tablet    Sig: Take 1 tablet (75 mg total)  by mouth 2 (two) times daily. For muscle and  Joint pain    Dispense:  60 tablet    Refill:  2     Follow-up: Return in about 1 month (around 12/03/2015).  Mechele Claude, M.D.

## 2015-11-05 ENCOUNTER — Other Ambulatory Visit: Payer: Self-pay | Admitting: Nurse Practitioner

## 2015-11-05 ENCOUNTER — Telehealth: Payer: Self-pay | Admitting: Family Medicine

## 2015-11-05 DIAGNOSIS — I10 Essential (primary) hypertension: Secondary | ICD-10-CM

## 2015-11-05 DIAGNOSIS — S92352A Displaced fracture of fifth metatarsal bone, left foot, initial encounter for closed fracture: Secondary | ICD-10-CM | POA: Diagnosis not present

## 2015-11-06 ENCOUNTER — Encounter: Payer: Self-pay | Admitting: Internal Medicine

## 2015-11-26 DIAGNOSIS — S92352D Displaced fracture of fifth metatarsal bone, left foot, subsequent encounter for fracture with routine healing: Secondary | ICD-10-CM | POA: Diagnosis not present

## 2015-12-03 ENCOUNTER — Ambulatory Visit (INDEPENDENT_AMBULATORY_CARE_PROVIDER_SITE_OTHER): Payer: BLUE CROSS/BLUE SHIELD | Admitting: Family Medicine

## 2015-12-03 ENCOUNTER — Encounter: Payer: Self-pay | Admitting: Family Medicine

## 2015-12-03 VITALS — BP 132/84 | HR 62 | Temp 97.8°F | Ht 70.0 in | Wt 254.6 lb

## 2015-12-03 DIAGNOSIS — I10 Essential (primary) hypertension: Secondary | ICD-10-CM

## 2015-12-03 LAB — URINALYSIS
APPEARANCE UR: NEGATIVE
Bilirubin, UA: NEGATIVE
COLOR UA: NEGATIVE
GLUCOSE, UA: NEGATIVE
KETONES UA: NEGATIVE
LEUKOCYTES UA: NEGATIVE
NITRITE UA: NEGATIVE
Protein, UA: NEGATIVE
RBC, UA: NEGATIVE
Specific Gravity, UA: 1.02 (ref 1.005–1.030)
UUROB: 0.2 mg/dL (ref 0.2–1.0)
pH, UA: 5.5 (ref 5.0–7.5)

## 2015-12-03 MED ORDER — SERTRALINE HCL 50 MG PO TABS: 50.0000 mg | ORAL_TABLET | Freq: Every day | ORAL | 3 refills | Status: DC

## 2015-12-03 NOTE — Progress Notes (Signed)
Subjective:  Patient ID: Benjamin Edwards, male    DOB: [birth year]  Age: 50 y.o. MRN: [id number]  CC: Depression (1 mth rck, " grumpy not depressed")   HPI Benjamin Edwards presents for  follow-up of hypertension. Patient has no history of headache chest pain or shortness of breath or recent cough. Patient also denies symptoms of TIA such as numbness weakness lateralizing. Patient checks  blood pressure at home and has not had any elevated readings recently. Patient denies side effects from his medication. States taking it regularly. Patient in for follow-up of elevated cholesterol. Doing well without complaints on current medication. Denies side effects of statin including myalgia and arthralgia and nausea. Also in today for liver function testing. Currently no chest pain, shortness of breath or other cardiovascular related symptoms noted.  Grumpy and irritable since discontinuing the mirtazapine. He wants to go back on the sertraline. He states that the mirtazapine made him so groggy the next day he couldn't function in the morning. He has to be at work at 5:30 to 6 in the morning. Sertraline seemed to control her symptoms adequately, therefore he would like to resume this. His grumpiness was limited to when he was not on any medication.  Depression screen [facility] 2/9 12/03/2015 11/02/2015 05/03/2014  Decreased Interest 0 3 3  Down, Depressed, Hopeless 0 1 2  PHQ - 2 Score 0 4 5  Altered sleeping - 3 2  Tired, decreased energy - 2 2  Change in appetite - 0 0  Feeling bad or failure about yourself  - 1 1  Trouble concentrating - 1 0  Moving slowly or fidgety/restless - 0 1  Suicidal thoughts - 0 0  PHQ-9 Score - 11 11  Difficult doing work/chores - Not difficult at all -   Doesn't sleep well. 4-5 hours a night.    History Benjamin Edwards has a past medical history of Hyperlipidemia and Hypertension.   He has no past surgical history on file.   His family history includes Heart disease in his father;  Hyperlipidemia in his father; Hypertension in his father.He reports that he has never smoked. His smokeless tobacco use includes Snuff. He reports that he does not drink alcohol or use drugs.    Medication List       Accurate as of 12/03/15  4:49 PM. Always use your most recent med list.          Amlodipine-Valsartan-HCTZ 10-320-25 MG Tabs TAKE 1 TABLET BY MOUTH DAILY.   aspirin 81 MG tablet Take 81 mg by mouth daily.   atorvastatin 20 MG tablet Commonly known as:  LIPITOR Take 1 tablet (20 mg total) by mouth daily.   diclofenac 75 MG EC tablet Commonly known as:  VOLTAREN Take 1 tablet (75 mg total) by mouth 2 (two) times daily. For muscle and  Joint pain   metoprolol 200 MG 24 hr tablet Commonly known as:  TOPROL-XL Take 1 tablet (200 mg total) by mouth daily.   mirtazapine 30 MG tablet Commonly known as:  REMERON Take 1 tablet (30 mg total) by mouth at bedtime.        ROS Review of Systems  Constitutional: Negative for chills, diaphoresis, fever and unexpected weight change.  HENT: Negative for congestion, hearing loss, rhinorrhea and sore throat.   Eyes: Negative for visual disturbance.  Respiratory: Negative for cough and shortness of breath.   Cardiovascular: Negative for chest pain.  Gastrointestinal: Negative for abdominal pain, constipation and diarrhea.  Genitourinary: Negative for  dysuria and flank pain.  Musculoskeletal: Negative for arthralgias and joint swelling.  Skin: Negative for rash.  Neurological: Negative for dizziness and headaches.  Psychiatric/Behavioral: Negative for dysphoric mood and sleep disturbance.    Objective:  BP 132/84 (BP Location: Left Arm, Patient Position: Sitting, Cuff Size: Large)   Pulse 62   Temp 97.8 F (36.6 C) (Oral)   Ht 5\' 10"  (1.778 m)   Wt 254 lb 9.6 oz (115.5 kg)   SpO2 97%   BMI 36.53 kg/m   BP Readings from Last 3 Encounters:  12/03/15 132/84  11/02/15 125/74  05/04/15 (!) 176/107    Wt Readings  from Last 3 Encounters:  12/03/15 254 lb 9.6 oz (115.5 kg)  11/02/15 253 lb 6.4 oz (114.9 kg)  05/04/15 256 lb (116.1 kg)     Physical Exam  Constitutional: He is oriented to person, place, and time. He appears well-developed and well-nourished. No distress.  HENT:  Head: Normocephalic and atraumatic.  Right Ear: External ear normal.  Left Ear: External ear normal.  Nose: Nose normal.  Mouth/Throat: Oropharynx is clear and moist.  Eyes: Conjunctivae and EOM are normal. Pupils are equal, round, and reactive to light.  Neck: Normal range of motion. Neck supple. No thyromegaly present.  Cardiovascular: Normal rate, regular rhythm and normal heart sounds.   No murmur heard. Pulmonary/Chest: Effort normal and breath sounds normal. No respiratory distress. He has no wheezes. He has no rales.  Abdominal: Soft. Bowel sounds are normal. He exhibits no distension. There is no tenderness.  Lymphadenopathy:    He has no cervical adenopathy.  Neurological: He is alert and oriented to person, place, and time. He has normal reflexes.  Skin: Skin is warm and dry.  Psychiatric: He has a normal mood and affect. His behavior is normal. Judgment and thought content normal.     Lab Results  Component Value Date   WBC 5.0 05/03/2014   HGB 14.7 05/03/2014   HCT 42.5 05/03/2014   PLT 216 05/03/2014   GLUCOSE 136 (H) 10/20/2014   CHOL 254 (H) 05/03/2014   TRIG 207 (H) 05/03/2014   HDL 34 (L) 05/03/2014   LDLCALC 179 (H) 05/03/2014   ALT 24 10/20/2014   AST 20 10/20/2014   NA 141 10/20/2014   K 3.6 10/20/2014   CL 97 10/20/2014   CREATININE 1.20 10/20/2014   BUN 14 10/20/2014   CO2 26 10/20/2014   TSH 1.230 05/03/2014   PSA 0.6 05/03/2014      Assessment & Plan:   There are no diagnoses linked to this encounter.    I am having [name] maintain his aspirin, atorvastatin, metoprolol, mirtazapine, diclofenac, and Amlodipine-Valsartan-HCTZ.  No orders of the defined types were  placed in this encounter.    Follow-up: No Follow-up on file.  Mechele ClaudeWarren Irena Gaydos, M.D.

## 2015-12-04 LAB — CMP14+EGFR
ALBUMIN: 4.5 g/dL (ref 3.5–5.5)
ALT: 30 IU/L (ref 0–44)
AST: 24 IU/L (ref 0–40)
Albumin/Globulin Ratio: 2 (ref 1.2–2.2)
Alkaline Phosphatase: 50 IU/L (ref 39–117)
BUN / CREAT RATIO: 14 (ref 9–20)
BUN: 15 mg/dL (ref 6–24)
Bilirubin Total: 0.3 mg/dL (ref 0.0–1.2)
CALCIUM: 9.5 mg/dL (ref 8.7–10.2)
CO2: 27 mmol/L (ref 18–29)
CREATININE: 1.09 mg/dL (ref 0.76–1.27)
Chloride: 100 mmol/L (ref 96–106)
GFR, EST AFRICAN AMERICAN: 90 mL/min/{1.73_m2} (ref >59)
GFR, EST NON AFRICAN AMERICAN: 78 mL/min/{1.73_m2} (ref >59)
GLOBULIN, TOTAL: 2.3 g/dL (ref 1.5–4.5)
GLUCOSE: 105 mg/dL — AB (ref 65–99)
Potassium: 3.6 mmol/L (ref 3.5–5.2)
SODIUM: 145 mmol/L — AB (ref 134–144)
TOTAL PROTEIN: 6.8 g/dL (ref 6.0–8.5)

## 2015-12-04 LAB — LIPID PANEL
CHOL/HDL RATIO: 4.9 ratio (ref 0.0–5.0)
CHOLESTEROL TOTAL: 151 mg/dL (ref 100–199)
HDL: 31 mg/dL — ABNORMAL LOW (ref >39)
LDL CALC: 59 mg/dL (ref 0–99)
Triglycerides: 306 mg/dL — ABNORMAL HIGH (ref 0–149)
VLDL CHOLESTEROL CAL: 61 mg/dL — AB (ref 5–40)

## 2015-12-04 LAB — CBC WITH DIFFERENTIAL/PLATELET
BASOS ABS: 0 10*3/uL (ref 0.0–0.2)
Basos: 0 %
EOS (ABSOLUTE): 0.2 10*3/uL (ref 0.0–0.4)
EOS: 3 %
HEMATOCRIT: 36.5 % — AB (ref 37.5–51.0)
HEMOGLOBIN: 13.2 g/dL (ref 12.6–17.7)
IMMATURE GRANS (ABS): 0 10*3/uL (ref 0.0–0.1)
IMMATURE GRANULOCYTES: 0 %
LYMPHS ABS: 2.2 10*3/uL (ref 0.7–3.1)
LYMPHS: 37 %
MCH: 28.3 pg (ref 26.6–33.0)
MCHC: 36.2 g/dL — AB (ref 31.5–35.7)
MCV: 78 fL — ABNORMAL LOW (ref 79–97)
MONOCYTES: 9 %
Monocytes Absolute: 0.6 10*3/uL (ref 0.1–0.9)
Neutrophils Absolute: 3.1 10*3/uL (ref 1.4–7.0)
Neutrophils: 51 %
Platelets: 197 10*3/uL (ref 150–379)
RBC: 4.67 x10E6/uL (ref 4.14–5.80)
RDW: 14.9 % (ref 12.3–15.4)
WBC: 6.1 10*3/uL (ref 3.4–10.8)

## 2015-12-25 ENCOUNTER — Ambulatory Visit (AMBULATORY_SURGERY_CENTER): Payer: Self-pay | Admitting: *Deleted

## 2015-12-25 ENCOUNTER — Encounter (INDEPENDENT_AMBULATORY_CARE_PROVIDER_SITE_OTHER): Payer: Self-pay

## 2015-12-25 VITALS — Ht 70.0 in | Wt 250.0 lb

## 2015-12-25 DIAGNOSIS — Z1211 Encounter for screening for malignant neoplasm of colon: Secondary | ICD-10-CM

## 2015-12-25 NOTE — Progress Notes (Signed)
No egg or soy allergy known to patient  No issues with past sedation with any surgeries  or procedures, no intubation problems  No diet pills per patient No home 02 use per patient  No blood thinners per patient  Pt denies issues with constipation   hx of  A fib--no recent episodes on medication for this  emmi video to wifes e mail- her procedure is the same day w/ gessner

## 2015-12-26 ENCOUNTER — Encounter: Payer: Self-pay | Admitting: Internal Medicine

## 2016-01-07 ENCOUNTER — Encounter: Payer: Self-pay | Admitting: Internal Medicine

## 2016-02-02 ENCOUNTER — Other Ambulatory Visit: Payer: Self-pay | Admitting: Family Medicine

## 2016-04-06 ENCOUNTER — Other Ambulatory Visit: Payer: Self-pay | Admitting: Family Medicine

## 2016-04-28 ENCOUNTER — Other Ambulatory Visit: Payer: Self-pay | Admitting: Family Medicine

## 2016-04-28 ENCOUNTER — Ambulatory Visit (INDEPENDENT_AMBULATORY_CARE_PROVIDER_SITE_OTHER): Payer: BLUE CROSS/BLUE SHIELD | Admitting: Family

## 2016-04-28 ENCOUNTER — Encounter: Payer: Self-pay | Admitting: Family

## 2016-04-28 VITALS — BP 131/85 | HR 67 | Temp 97.9°F | Ht 70.0 in | Wt 255.6 lb

## 2016-04-28 DIAGNOSIS — Z0000 Encounter for general adult medical examination without abnormal findings: Secondary | ICD-10-CM

## 2016-04-28 DIAGNOSIS — F411 Generalized anxiety disorder: Secondary | ICD-10-CM | POA: Diagnosis not present

## 2016-04-28 DIAGNOSIS — Z114 Encounter for screening for human immunodeficiency virus [HIV]: Secondary | ICD-10-CM

## 2016-04-28 DIAGNOSIS — Z125 Encounter for screening for malignant neoplasm of prostate: Secondary | ICD-10-CM

## 2016-04-28 DIAGNOSIS — M1990 Unspecified osteoarthritis, unspecified site: Secondary | ICD-10-CM | POA: Diagnosis not present

## 2016-04-28 DIAGNOSIS — E785 Hyperlipidemia, unspecified: Secondary | ICD-10-CM

## 2016-04-28 DIAGNOSIS — I10 Essential (primary) hypertension: Secondary | ICD-10-CM | POA: Diagnosis not present

## 2016-04-28 DIAGNOSIS — E8881 Metabolic syndrome: Secondary | ICD-10-CM

## 2016-04-28 DIAGNOSIS — Z1211 Encounter for screening for malignant neoplasm of colon: Secondary | ICD-10-CM

## 2016-04-28 NOTE — Progress Notes (Signed)
Subjective:    Patient ID: Benjamin Edwards, male    DOB: 21-Sep-1965, 51 y.o.   MRN: [id number]  PT presents to the office today for CPE and DMV forms.  Hypertension  This is a chronic problem. The current episode started more than 1 year ago. The problem has been resolved since onset. The problem is controlled. Associated symptoms include anxiety. Pertinent negatives include no headaches, palpitations, peripheral edema or shortness of breath. Risk factors for coronary artery disease include dyslipidemia, obesity, male gender, sedentary lifestyle and family history. Past treatments include calcium channel blockers and angiotensin blockers. The current treatment provides moderate improvement. There is no history of kidney disease, CAD/MI, CVA or heart failure. There is no history of a thyroid problem.  Hyperlipidemia  This is a chronic problem. The current episode started more than 1 year ago. The problem is controlled. Recent lipid tests were reviewed and are normal. Exacerbating diseases include obesity. Pertinent negatives include no shortness of breath. Current antihyperlipidemic treatment includes statins. The current treatment provides moderate improvement of lipids. Risk factors for coronary artery disease include dyslipidemia, family history, hypertension, male sex and obesity.  Anxiety  Presents for follow-up visit. Symptoms include excessive worry and nervous/anxious behavior. Patient reports no decreased concentration, depressed mood, irritability, palpitations or shortness of breath. Symptoms occur rarely. The quality of sleep is good.    Arthritis  Presents for follow-up visit. He complains of pain. Affected locations include the right MCP, left MCP, left elbow and right elbow. His pain is at a severity of 7/10.  Metabolic Syndrome PT states he does exercise regularly, but is active at work. Pt states he is not on a low fat or carb diet.     Review of Systems  Constitutional:  Negative for irritability.  Respiratory: Negative for shortness of breath.   Cardiovascular: Negative for palpitations.  Musculoskeletal: Positive for arthritis.  Neurological: Negative for headaches.  Psychiatric/Behavioral: Negative for decreased concentration. The patient is nervous/anxious.   All other systems reviewed and are negative.      Objective:   Physical Exam  Constitutional: He is oriented to person, place, and time. He appears well-developed and well-nourished. No distress.  HENT:  Head: Normocephalic.  Right Ear: External ear normal.  Left Ear: External ear normal.  Nose: Nose normal.  Mouth/Throat: Oropharynx is clear and moist.  Eyes: Pupils are equal, round, and reactive to light. Right eye exhibits no discharge. Left eye exhibits no discharge.  Neck: Normal range of motion. Neck supple. No thyromegaly present.  Cardiovascular: Normal rate, regular rhythm, normal heart sounds and intact distal pulses.   No murmur heard. Pulmonary/Chest: Effort normal and breath sounds normal. No respiratory distress. He has no wheezes.  Abdominal: Soft. Bowel sounds are normal. He exhibits no distension. There is no tenderness.  Musculoskeletal: Normal range of motion. He exhibits no edema or tenderness.  Neurological: He is alert and oriented to person, place, and time. He has normal reflexes. No cranial nerve deficit.  Skin: Skin is warm and dry. No rash noted. No erythema.  Psychiatric: He has a normal mood and affect. His behavior is normal. Judgment and thought content normal.  Vitals reviewed.    BP 131/85   Pulse 67   Temp 97.9 F (36.6 C) (Oral)   Ht '5\' 10"'$  (1.778 m)   Wt 255 lb 9.6 oz (115.9 kg)   BMI 36.67 kg/m       Assessment & Plan:  1. Essential hypertension - CMP14+EGFR  2.  Arthritis - CMP14+EGFR  3. GAD (generalized anxiety disorder) - UJW11+BJYN  4. Metabolic syndrome - WGN56+OZHY - Lipid panel  5. Annual physical exam - CMP14+EGFR - CBC  with Differential/Platelet - Lipid panel - PSA, total and free - Thyroid Panel With TSH - VITAMIN D 25 Hydroxy (Vit-D Deficiency, Fractures) - HIV antibody - Fecal occult blood, imunochemical; Future - Ambulatory referral to Gastroenterology  6. Colon cancer screening - CMP14+EGFR - Fecal occult blood, imunochemical; Future - Ambulatory referral to Gastroenterology  7. Encounter for screening for HIV - CMP14+EGFR - HIV antibody  8. Hyperlipidemia, unspecified hyperlipidemia type - CMP14+EGFR - Lipid panel  9. Prostate cancer screening - CMP14+EGFR - PSA, total and free   Continue all meds Labs pending Health Maintenance reviewed Diet and exercise encouraged RTO 6 months  Evelina Dun, [name]

## 2016-04-28 NOTE — Patient Instructions (Signed)

## 2016-04-29 ENCOUNTER — Other Ambulatory Visit: Payer: Self-pay | Admitting: Family

## 2016-04-29 LAB — CBC WITH DIFFERENTIAL/PLATELET
BASOS: 0 %
Basophils Absolute: 0 10*3/uL (ref 0.0–0.2)
EOS (ABSOLUTE): 0.2 10*3/uL (ref 0.0–0.4)
EOS: 2 %
HEMATOCRIT: 41.6 % (ref 37.5–51.0)
HEMOGLOBIN: 14.3 g/dL (ref 13.0–17.7)
IMMATURE GRANS (ABS): 0 10*3/uL (ref 0.0–0.1)
IMMATURE GRANULOCYTES: 0 %
LYMPHS: 37 %
Lymphocytes Absolute: 2.7 10*3/uL (ref 0.7–3.1)
MCH: 27.9 pg (ref 26.6–33.0)
MCHC: 34.4 g/dL (ref 31.5–35.7)
MCV: 81 fL (ref 79–97)
MONOCYTES: 6 %
Monocytes Absolute: 0.5 10*3/uL (ref 0.1–0.9)
NEUTROS ABS: 4 10*3/uL (ref 1.4–7.0)
Neutrophils: 55 %
Platelets: 241 10*3/uL (ref 150–379)
RBC: 5.13 x10E6/uL (ref 4.14–5.80)
RDW: 14.9 % (ref 12.3–15.4)
WBC: 7.4 10*3/uL (ref 3.4–10.8)

## 2016-04-29 LAB — CMP14+EGFR
A/G RATIO: 1.6 (ref 1.2–2.2)
ALT: 32 IU/L (ref 0–44)
AST: 26 IU/L (ref 0–40)
Albumin: 4.6 g/dL (ref 3.5–5.5)
Alkaline Phosphatase: 57 IU/L (ref 39–117)
BUN/Creatinine Ratio: 10 (ref 9–20)
BUN: 11 mg/dL (ref 6–24)
Bilirubin Total: 0.3 mg/dL (ref 0.0–1.2)
CALCIUM: 9.4 mg/dL (ref 8.7–10.2)
CO2: 26 mmol/L (ref 18–29)
CREATININE: 1.07 mg/dL (ref 0.76–1.27)
Chloride: 98 mmol/L (ref 96–106)
GFR, EST AFRICAN AMERICAN: 92 mL/min/{1.73_m2} (ref >59)
GFR, EST NON AFRICAN AMERICAN: 80 mL/min/{1.73_m2} (ref >59)
Globulin, Total: 2.9 g/dL (ref 1.5–4.5)
Glucose: 96 mg/dL (ref 65–99)
Potassium: 3.6 mmol/L (ref 3.5–5.2)
Sodium: 141 mmol/L (ref 134–144)
TOTAL PROTEIN: 7.5 g/dL (ref 6.0–8.5)

## 2016-04-29 LAB — PSA, TOTAL AND FREE
PROSTATE SPECIFIC AG, SERUM: 0.5 ng/mL (ref 0.0–4.0)
PSA FREE: 0.15 ng/mL
PSA, Free Pct: 30 %

## 2016-04-29 LAB — LIPID PANEL
Chol/HDL Ratio: 6.9 ratio units — ABNORMAL HIGH (ref 0.0–5.0)
Cholesterol, Total: 201 mg/dL — ABNORMAL HIGH (ref 100–199)
HDL: 29 mg/dL — AB (ref >39)
Triglycerides: 761 mg/dL (ref 0–149)

## 2016-04-29 LAB — THYROID PANEL WITH TSH
Free Thyroxine Index: 1.7 (ref 1.2–4.9)
T3 Uptake Ratio: 23 % — ABNORMAL LOW (ref 24–39)
T4, Total: 7.3 ug/dL (ref 4.5–12.0)
TSH: 2.01 u[IU]/mL (ref 0.450–4.500)

## 2016-04-29 LAB — HIV ANTIBODY (ROUTINE TESTING W REFLEX): HIV Screen 4th Generation wRfx: NONREACTIVE

## 2016-04-29 LAB — VITAMIN D 25 HYDROXY (VIT D DEFICIENCY, FRACTURES): VIT D 25 HYDROXY: 22.4 ng/mL — AB (ref 30.0–100.0)

## 2016-04-29 MED ORDER — SERTRALINE HCL 50 MG PO TABS: 50.0000 mg | ORAL_TABLET | Freq: Every day | ORAL | 3 refills | Status: DC

## 2016-04-29 MED ORDER — FENOFIBRATE 145 MG PO TABS: 145.0000 mg | ORAL_TABLET | Freq: Every day | ORAL | 1 refills | Status: DC

## 2016-04-29 MED ORDER — VITAMIN D (ERGOCALCIFEROL) 1.25 MG (50000 UNIT) PO CAPS: 50000.0000 [IU] | ORAL_CAPSULE | ORAL | 3 refills | Status: DC

## 2016-06-09 ENCOUNTER — Other Ambulatory Visit: Payer: Self-pay

## 2016-06-09 DIAGNOSIS — I10 Essential (primary) hypertension: Secondary | ICD-10-CM

## 2016-06-09 MED ORDER — SERTRALINE HCL 50 MG PO TABS: 50.0000 mg | ORAL_TABLET | Freq: Every day | ORAL | 0 refills | Status: DC

## 2016-06-09 MED ORDER — AMLODIPINE-VALSARTAN-HCTZ 10-320-25 MG PO TABS: 1.0000 | ORAL_TABLET | Freq: Every day | ORAL | 0 refills | Status: DC

## 2016-09-09 ENCOUNTER — Other Ambulatory Visit: Payer: Self-pay | Admitting: Family Medicine

## 2016-10-14 ENCOUNTER — Other Ambulatory Visit: Payer: Self-pay | Admitting: Family Medicine

## 2016-10-14 DIAGNOSIS — I10 Essential (primary) hypertension: Secondary | ICD-10-CM

## 2016-10-14 DIAGNOSIS — E785 Hyperlipidemia, unspecified: Secondary | ICD-10-CM

## 2016-10-15 NOTE — Telephone Encounter (Signed)
Last seen 04/28/16  Benjamin Edwards 

## 2016-10-27 ENCOUNTER — Encounter: Payer: Self-pay | Admitting: Family

## 2016-10-27 ENCOUNTER — Ambulatory Visit (INDEPENDENT_AMBULATORY_CARE_PROVIDER_SITE_OTHER): Payer: BLUE CROSS/BLUE SHIELD | Admitting: Family

## 2016-10-27 VITALS — BP 124/85 | HR 63 | Temp 97.6°F | Ht 70.0 in | Wt 244.0 lb

## 2016-10-27 DIAGNOSIS — E785 Hyperlipidemia, unspecified: Secondary | ICD-10-CM | POA: Diagnosis not present

## 2016-10-27 DIAGNOSIS — M1990 Unspecified osteoarthritis, unspecified site: Secondary | ICD-10-CM | POA: Diagnosis not present

## 2016-10-27 DIAGNOSIS — E8881 Metabolic syndrome: Secondary | ICD-10-CM | POA: Diagnosis not present

## 2016-10-27 DIAGNOSIS — F411 Generalized anxiety disorder: Secondary | ICD-10-CM | POA: Diagnosis not present

## 2016-10-27 DIAGNOSIS — I10 Essential (primary) hypertension: Secondary | ICD-10-CM

## 2016-10-27 NOTE — Progress Notes (Signed)
Subjective:    Patient ID: Benjamin Edwards, male    DOB: [birth year], 51 y.o.   MRN: [id number]  PT presents to the office today for chronic follow up. Pt states he has been enjoying fishing.  Hypertension  This is a chronic problem. The current episode started more than 1 year ago. The problem has been resolved since onset. The problem is controlled. Associated symptoms include anxiety. Pertinent negatives include no malaise/fatigue, peripheral edema or shortness of breath. Risk factors for coronary artery disease include dyslipidemia, obesity, family history and sedentary lifestyle. The current treatment provides moderate improvement. There is no history of kidney disease, CAD/MI, CVA or heart failure.  Hyperlipidemia  This is a chronic problem. The current episode started more than 1 year ago. The problem is uncontrolled. Recent lipid tests were reviewed and are high. Exacerbating diseases include obesity. Pertinent negatives include no shortness of breath. Current antihyperlipidemic treatment includes statins. The current treatment provides moderate improvement of lipids. Risk factors for coronary artery disease include dyslipidemia, hypertension, male sex and post-menopausal.  Arthritis  Presents for follow-up visit. He complains of pain and stiffness. Affected locations include the right elbow. His pain is at a severity of 3/10.  Anxiety  Presents for follow-up visit. Symptoms include excessive worry and nervous/anxious behavior. Patient reports no decreased concentration, irritability or shortness of breath. Symptoms occur occasionally.    Metabolic Syndrome  Pt triglycerides 761. Pt has not been on 'too much of a diet".    Review of Systems  Constitutional: Negative for irritability and malaise/fatigue.  Respiratory: Negative for shortness of breath.   Musculoskeletal: Positive for arthritis and stiffness.  Psychiatric/Behavioral: Negative for decreased concentration. The patient is  nervous/anxious.   All other systems reviewed and are negative.      Objective:   Physical Exam  Constitutional: He is oriented to person, place, and time. He appears well-developed and well-nourished. No distress.  HENT:  Head: Normocephalic.  Right Ear: External ear normal.  Left Ear: External ear normal.  Nose: Nose normal.  Mouth/Throat: Oropharynx is clear and moist.  Eyes: Pupils are equal, round, and reactive to light. Right eye exhibits no discharge. Left eye exhibits no discharge.  Neck: Normal range of motion. Neck supple. No thyromegaly present.  Cardiovascular: Normal rate, regular rhythm, normal heart sounds and intact distal pulses.   No murmur heard. Pulmonary/Chest: Effort normal and breath sounds normal. No respiratory distress. He has no wheezes.  Abdominal: Soft. Bowel sounds are normal. He exhibits no distension. There is no tenderness.  Musculoskeletal: Normal range of motion. He exhibits no edema or tenderness.  Neurological: He is alert and oriented to person, place, and time.  Skin: Skin is warm and dry. Rash (scaly rash on left knee) noted. No erythema.  Psychiatric: He has a normal mood and affect. His behavior is normal. Judgment and thought content normal.  Vitals reviewed.     BP 124/85   Pulse 63   Temp 97.6 F (36.4 C) (Oral)   Ht '5\' 10"'$  (1.778 m)   Wt 244 lb (110.7 kg)   BMI 35.01 kg/m      Assessment & Plan:  1. Essential hypertension - CMP14+EGFR  2. Arthritis - CMP14+EGFR  3. Morbid obesity (HCC) - ZHG99+MEQA  4. Metabolic syndrome - STM19+QQIW  5. Hyperlipidemia, unspecified hyperlipidemia type - CMP14+EGFR - Lipid panel  6. GAD (generalized anxiety disorder) - CMP14+EGFR   Continue all meds Labs pending Health Maintenance reviewed Diet and exercise encouraged RTO 6 months  Evelina Dun, [name]

## 2016-10-27 NOTE — Patient Instructions (Signed)

## 2016-10-28 LAB — CMP14+EGFR
A/G RATIO: 2 (ref 1.2–2.2)
ALT: 34 IU/L (ref 0–44)
AST: 30 IU/L (ref 0–40)
Albumin: 4.7 g/dL (ref 3.5–5.5)
Alkaline Phosphatase: 28 IU/L — ABNORMAL LOW (ref 39–117)
BUN / CREAT RATIO: 13 (ref 9–20)
BUN: 17 mg/dL (ref 6–24)
Bilirubin Total: 0.2 mg/dL (ref 0.0–1.2)
CALCIUM: 9.5 mg/dL (ref 8.7–10.2)
CHLORIDE: 103 mmol/L (ref 96–106)
CO2: 24 mmol/L (ref 20–29)
Creatinine, Ser: 1.33 mg/dL — ABNORMAL HIGH (ref 0.76–1.27)
GFR, EST AFRICAN AMERICAN: 71 mL/min/{1.73_m2} (ref >59)
GFR, EST NON AFRICAN AMERICAN: 61 mL/min/{1.73_m2} (ref >59)
GLOBULIN, TOTAL: 2.4 g/dL (ref 1.5–4.5)
Glucose: 90 mg/dL (ref 65–99)
POTASSIUM: 3.5 mmol/L (ref 3.5–5.2)
Sodium: 143 mmol/L (ref 134–144)
TOTAL PROTEIN: 7.1 g/dL (ref 6.0–8.5)

## 2016-10-28 LAB — LIPID PANEL
CHOL/HDL RATIO: 3.6 ratio (ref 0.0–5.0)
Cholesterol, Total: 145 mg/dL (ref 100–199)
HDL: 40 mg/dL (ref >39)
LDL Calculated: 76 mg/dL (ref 0–99)
Triglycerides: 144 mg/dL (ref 0–149)
VLDL Cholesterol Cal: 29 mg/dL (ref 5–40)

## 2016-10-29 ENCOUNTER — Other Ambulatory Visit: Payer: Self-pay | Admitting: Family

## 2016-11-18 ENCOUNTER — Other Ambulatory Visit: Payer: Self-pay | Admitting: Family Medicine

## 2016-11-18 DIAGNOSIS — I10 Essential (primary) hypertension: Secondary | ICD-10-CM

## 2017-01-17 ENCOUNTER — Other Ambulatory Visit: Payer: Self-pay | Admitting: Family

## 2017-01-17 DIAGNOSIS — E785 Hyperlipidemia, unspecified: Secondary | ICD-10-CM

## 2017-01-17 DIAGNOSIS — I10 Essential (primary) hypertension: Secondary | ICD-10-CM

## 2017-02-13 ENCOUNTER — Encounter: Payer: Self-pay | Admitting: Family

## 2017-02-13 ENCOUNTER — Ambulatory Visit: Payer: BLUE CROSS/BLUE SHIELD | Admitting: Family

## 2017-02-13 DIAGNOSIS — K219 Gastro-esophageal reflux disease without esophagitis: Secondary | ICD-10-CM

## 2017-02-13 MED ORDER — OMEPRAZOLE 20 MG PO CPDR: 20.0000 mg | DELAYED_RELEASE_CAPSULE | Freq: Every day | ORAL | 1 refills | Status: DC

## 2017-02-13 NOTE — Patient Instructions (Signed)
Food Choices for Gastroesophageal Reflux Disease, Adult When you have gastroesophageal reflux disease (GERD), the foods you eat and your eating habits are very important. Choosing the right foods can help ease your discomfort. What guidelines do I need to follow?  Choose fruits, vegetables, whole grains, and low-fat dairy products.  Choose low-fat meat, fish, and poultry.  Limit fats such as oils, salad dressings, butter, nuts, and avocado.  Keep a food diary. This helps you identify foods that cause symptoms.  Avoid foods that cause symptoms. These may be different for everyone.  Eat small meals often instead of 3 large meals a day.  Eat your meals slowly, in a place where you are relaxed.  Limit fried foods.  Cook foods using methods other than frying.  Avoid drinking alcohol.  Avoid drinking large amounts of liquids with your meals.  Avoid bending over or lying down until 2-3 hours after eating. What foods are not recommended? These are some foods and drinks that may make your symptoms worse: Vegetables Tomatoes. Tomato juice. Tomato and spaghetti sauce. Chili peppers. Onion and garlic. Horseradish. Fruits Oranges, grapefruit, and lemon (fruit and juice). Meats High-fat meats, fish, and poultry. This includes hot dogs, ribs, ham, sausage, salami, and bacon. Dairy Whole milk and chocolate milk. Sour cream. Cream. Butter. Ice cream. Cream cheese. Drinks Coffee and tea. Bubbly (carbonated) drinks or energy drinks. Condiments Hot sauce. Barbecue sauce. Sweets/Desserts Chocolate and cocoa. Donuts. Peppermint and spearmint. Fats and Oils High-fat foods. This includes [place] fries and potato chips. Other Vinegar. Strong spices. This includes black pepper, white pepper, red pepper, cayenne, curry powder, cloves, ginger, and chili powder. The items listed above may not be a complete list of foods and drinks to avoid. Contact your dietitian for more information. This  information is not intended to replace advice given to you by your health care provider. Make sure you discuss any questions you have with your health care provider. Document Released: 09/23/2011 Document Revised: 08/30/2015 Document Reviewed: 01/26/2013 Elsevier Interactive Patient Education  2017 Elsevier Inc. Heartburn Heartburn is a type of pain or discomfort that can happen in the throat or chest. It is often described as a burning pain. It may also cause a bad taste in the mouth. Heartburn may feel worse when you lie down or bend over, and it is often worse at night. Heartburn may be caused by stomach contents that move back up into the esophagus (reflux). Follow these instructions at home: Take these actions to decrease your discomfort and to help avoid complications. Diet  Follow a diet as recommended by your health care provider. This may involve avoiding foods and drinks such as: ? Coffee and tea (with or without caffeine). ? Drinks that contain alcohol. ? Energy drinks and sports drinks. ? Carbonated drinks or sodas. ? Chocolate and cocoa. ? Peppermint and mint flavorings. ? Garlic and onions. ? Horseradish. ? Spicy and acidic foods, including peppers, chili powder, curry powder, vinegar, hot sauces, and barbecue sauce. ? Citrus fruit juices and citrus fruits, such as oranges, lemons, and limes. ? Tomato-based foods, such as red sauce, chili, salsa, and pizza with red sauce. ? Fried and fatty foods, such as donuts, french fries, potato chips, and high-fat dressings. ? High-fat meats, such as hot dogs and fatty cuts of red and white meats, such as rib eye steak, sausage, ham, and bacon. ? High-fat dairy items, such as whole milk, butter, and cream cheese.  Eat small, frequent meals instead of large meals.  Avoid drinking large amounts of liquid with your meals.  Avoid eating meals during the 2-3 hours before bedtime.  Avoid lying down right after you eat.  Do not exercise  right after you eat. General instructions  Pay attention to any changes in your symptoms.  Take over-the-counter and prescription medicines only as told by your health care provider. Do not take aspirin, ibuprofen, or other NSAIDs unless your health care provider told you to do so.  Do not use any tobacco products, including cigarettes, chewing tobacco, and e-cigarettes. If you need help quitting, ask your health care provider.  Wear loose-fitting clothing. Do not wear anything tight around your waist that causes pressure on your abdomen.  Raise (elevate) the head of your bed about 6 inches (15 cm).  Try to reduce your stress, such as with yoga or meditation. If you need help reducing stress, ask your health care provider.  If you are overweight, reduce your weight to an amount that is healthy for you. Ask your health care provider for guidance about a safe weight loss goal.  Keep all follow-up visits as told by your health care provider. This is important. Contact a health care provider if:  You have new symptoms.  You have unexplained weight loss.  You have difficulty swallowing, or it hurts to swallow.  You have wheezing or a persistent cough.  Your symptoms do not improve with treatment.  You have frequent heartburn for more than two weeks. Get help right away if:  You have pain in your arms, neck, jaw, teeth, or back.  You feel sweaty, dizzy, or light-headed.  You have chest pain or shortness of breath.  You vomit and your vomit looks like blood or coffee grounds.  Your stool is bloody or black. This information is not intended to replace advice given to you by your health care provider. Make sure you discuss any questions you have with your health care provider. Document Released: 08/10/2008 Document Revised: 08/30/2015 Document Reviewed: 07/19/2014 Elsevier Interactive Patient Education  2017 [employer]

## 2017-02-13 NOTE — Progress Notes (Signed)
   Subjective:    Patient ID: Benjamin Edwards, male    DOB: June 01, 1965, 51 y.o.   MRN: [id number]  Gastroesophageal Reflux  He complains of heartburn. He reports no belching, no coughing, no dysphagia, no early satiety, no globus sensation, no nausea, no sore throat or no wheezing. This is a new problem. The current episode started more than 1 month ago. The problem occurs occasionally. The problem has been waxing and waning. The heartburn does not wake him from sleep. The heartburn limits his activity. The heartburn changes with position. The symptoms are aggravated by bending and certain foods. Risk factors include obesity. He has tried nothing for the symptoms. The treatment provided no relief.      Review of Systems  HENT: Negative for sore throat.   Respiratory: Negative for cough and wheezing.   Gastrointestinal: Positive for heartburn. Negative for dysphagia and nausea.  All other systems reviewed and are negative.      Objective:   Physical Exam  Constitutional: He is oriented to person, place, and time. He appears well-developed and well-nourished. No distress.  HENT:  Head: Normocephalic.  Eyes: Pupils are equal, round, and reactive to light. Right eye exhibits no discharge. Left eye exhibits no discharge.  Neck: Normal range of motion. Neck supple. No thyromegaly present.  Cardiovascular: Normal rate, regular rhythm, normal heart sounds and intact distal pulses.  No murmur heard. Pulmonary/Chest: Effort normal and breath sounds normal. No respiratory distress. He has no wheezes.  Abdominal: Soft. Bowel sounds are normal. He exhibits no distension. There is no tenderness.  Musculoskeletal: Normal range of motion. He exhibits no edema or tenderness.  Neurological: He is alert and oriented to person, place, and time.  Skin: Skin is warm and dry. No rash noted. No erythema.  Psychiatric: He has a normal mood and affect. His behavior is normal. Judgment and thought content normal.   Vitals reviewed.    BP 127/86   Pulse 68   Temp 97.8 F (36.6 C) (Oral)   Ht 5\' 10"  (1.778 m)   Wt 244 lb (110.7 kg)   BMI 35.01 kg/m      Assessment & Plan:  1. Gastroesophageal reflux disease, esophagitis presence not specified -Diet discussed- Avoid fried, spicy, citrus foods, caffeine and alcohol -Do not eat 2-3 hours before bedtime -Encouraged small frequent meals -Avoid NSAID's Meds ordered this encounter  Medications  . omeprazole (PRILOSEC) 20 MG capsule    Sig: Take 1 capsule (20 mg total) daily by mouth.    Dispense:  90 capsule    Refill:  1    Order Specific Question:   Supervising Provider    Answer:   Arville CareDETTINGER, JOSHUA A [[id number]]       Benjamin Rodneyhristy Tyra Michelle, [name]

## 2017-04-04 ENCOUNTER — Other Ambulatory Visit: Payer: Self-pay | Admitting: Family

## 2017-04-09 ENCOUNTER — Encounter: Payer: Self-pay | Admitting: Family

## 2017-04-20 ENCOUNTER — Other Ambulatory Visit: Payer: Self-pay | Admitting: Family

## 2017-04-20 DIAGNOSIS — I10 Essential (primary) hypertension: Secondary | ICD-10-CM

## 2017-04-20 DIAGNOSIS — E785 Hyperlipidemia, unspecified: Secondary | ICD-10-CM

## 2017-04-30 ENCOUNTER — Other Ambulatory Visit: Payer: Self-pay | Admitting: Family

## 2017-05-01 ENCOUNTER — Ambulatory Visit: Payer: BLUE CROSS/BLUE SHIELD | Admitting: Family

## 2017-05-01 ENCOUNTER — Encounter: Payer: Self-pay | Admitting: Family

## 2017-05-01 VITALS — BP 112/72 | HR 55 | Temp 97.0°F | Ht 70.0 in | Wt 244.2 lb

## 2017-05-01 DIAGNOSIS — Z1211 Encounter for screening for malignant neoplasm of colon: Secondary | ICD-10-CM | POA: Diagnosis not present

## 2017-05-01 DIAGNOSIS — F411 Generalized anxiety disorder: Secondary | ICD-10-CM

## 2017-05-01 DIAGNOSIS — K219 Gastro-esophageal reflux disease without esophagitis: Secondary | ICD-10-CM

## 2017-05-01 DIAGNOSIS — E785 Hyperlipidemia, unspecified: Secondary | ICD-10-CM | POA: Diagnosis not present

## 2017-05-01 DIAGNOSIS — G5603 Carpal tunnel syndrome, bilateral upper limbs: Secondary | ICD-10-CM

## 2017-05-01 DIAGNOSIS — M1990 Unspecified osteoarthritis, unspecified site: Secondary | ICD-10-CM

## 2017-05-01 DIAGNOSIS — I10 Essential (primary) hypertension: Secondary | ICD-10-CM | POA: Diagnosis not present

## 2017-05-01 MED ORDER — NAPROXEN 500 MG PO TABS: 500.0000 mg | ORAL_TABLET | Freq: Two times a day (BID) | ORAL | 1 refills | Status: DC

## 2017-05-01 NOTE — Patient Instructions (Signed)
Carpal Tunnel Release Carpal tunnel release is a surgical procedure to relieve numbness and pain in your hand that are caused by carpal tunnel syndrome. Your carpal tunnel is a narrow, hollow space in your wrist. It passes between your wrist bones and a band of connective tissue (transverse carpal ligament). The nerve that supplies most of your hand (median nerve) passes through this space, and so do the connections between your fingers and the muscles of your arm (tendons). Carpal tunnel syndrome makes this space swell and become narrow, and this causes pain and numbness. In carpal tunnel release surgery, a surgeon cuts through the transverse carpal ligament to make more room in the carpal tunnel space. You may have this surgery if other types of treatment have not worked. Tell a health care provider about:  Any allergies you have.  All medicines you are taking, including vitamins, herbs, eye drops, creams, and over-the-counter medicines.  Any problems you or family members have had with anesthetic medicines.  Any blood disorders you have.  Any surgeries you have had.  Any medical conditions you have. What are the risks? Generally, this is a safe procedure. However, problems may occur, including:  Bleeding.  Infection.  Injury to the median nerve.  Need for additional surgery.  What happens before the procedure?  Ask your health care provider about: ? Changing or stopping your regular medicines. This is especially important if you are taking diabetes medicines or blood thinners. ? Taking medicines such as aspirin and ibuprofen. These medicines can thin your blood. Do not take these medicines before your procedure if your health care provider instructs you not to.  Do not eat or drink anything after midnight on the night before the procedure or as directed by your health care provider.  Plan to have someone take you home after the procedure. What happens during the  procedure?  An IV tube may be inserted into a vein.  You will be given one of the following: ? A medicine that numbs the wrist area (local anesthetic). You may also be given a medicine to make you relax (sedative). ? A medicine that makes you go to sleep (general anesthetic).  Your arm, hand, and wrist will be cleaned with a germ-killing solution (antiseptic).  Your surgeon will make a surgical cut (incision) over the palm side of your wrist. The surgeon will pull aside the skin of your wrist to expose the carpal tunnel space.  The surgeon will cut the transverse carpal ligament.  The edges of the incision will be closed with stitches (sutures) or staples.  A bandage (dressing) will be placed over your wrist and wrapped around your hand and wrist. What happens after the procedure?  You may spend some time in a recovery area.  Your blood pressure, heart rate, breathing rate, and blood oxygen level will be monitored often until the medicines you were given have worn off.  You will likely have some pain. You will be given pain medicine.  You may need to wear a splint or a wrist brace over your dressing. This information is not intended to replace advice given to you by your health care provider. Make sure you discuss any questions you have with your health care provider. Document Released: 06/14/2003 Document Revised: 08/30/2015 Document Reviewed: 11/09/2013 Elsevier Interactive Patient Education  2018 Elsevier Inc.  

## 2017-05-01 NOTE — Progress Notes (Signed)
Subjective:    Patient ID: Benjamin Edwards, male    DOB: 22-Jul-1965, 52 y.o.   MRN: [id number]  PT presents to the office today for chronic follow up. Pt states he has been enjoying fishing. Hypertension  This is a chronic problem. The current episode started more than 1 year ago. The problem has been resolved since onset. The problem is controlled. Associated symptoms include anxiety. Pertinent negatives include no peripheral edema or shortness of breath. Risk factors for coronary artery disease include dyslipidemia, obesity and sedentary lifestyle. The current treatment provides moderate improvement. There is no history of kidney disease, CAD/MI, CVA or heart failure.  Gastroesophageal Reflux  He complains of belching and heartburn. He reports no coughing. This is a chronic problem. The current episode started more than 1 year ago. The problem occurs occasionally. The problem has been waxing and waning. Risk factors include obesity. He has tried a PPI for the symptoms. The treatment provided moderate relief.  Anxiety  Presents for follow-up visit. Patient reports no decreased concentration, excessive worry, irritability, nervous/anxious behavior or shortness of breath. Symptoms occur occasionally.    Arthritis  Presents for follow-up visit. He complains of stiffness. Affected locations include the left wrist, right wrist, right MCP and left MCP. His pain is at a severity of 9/10.  Hyperlipidemia  This is a chronic problem. The current episode started more than 1 year ago. The problem is controlled. Recent lipid tests were reviewed and are normal. Exacerbating diseases include obesity. Pertinent negatives include no shortness of breath. Current antihyperlipidemic treatment includes fibric acid derivatives and statins. The current treatment provides moderate improvement of lipids.      Review of Systems  Constitutional: Negative for irritability.  Respiratory: Negative for cough and shortness  of breath.   Gastrointestinal: Positive for heartburn.  Musculoskeletal: Positive for arthritis and stiffness.  Psychiatric/Behavioral: Negative for decreased concentration. The patient is not nervous/anxious.   All other systems reviewed and are negative.      Objective:   Physical Exam  Constitutional: He is oriented to person, place, and time. He appears well-developed and well-nourished. No distress.  HENT:  Head: Normocephalic.  Right Ear: External ear normal.  Left Ear: External ear normal.  Nose: Nose normal.  Mouth/Throat: Oropharynx is clear and moist.  Eyes: Pupils are equal, round, and reactive to light. Right eye exhibits no discharge. Left eye exhibits no discharge.  Neck: Normal range of motion. Neck supple. No thyromegaly present.  Cardiovascular: Normal rate, regular rhythm, normal heart sounds and intact distal pulses.  No murmur heard. Pulmonary/Chest: Effort normal and breath sounds normal. No respiratory distress. He has no wheezes.  Abdominal: Soft. Bowel sounds are normal. He exhibits no distension. There is no tenderness.  Musculoskeletal: Normal range of motion. He exhibits no edema or tenderness.  phalen and tinel positive   Neurological: He is alert and oriented to person, place, and time.  Skin: Skin is warm and dry. No rash noted. No erythema.  Psychiatric: He has a normal mood and affect. His behavior is normal. Judgment and thought content normal.  Vitals reviewed.     BP 112/72   Pulse (!) 55   Temp (!) 97 F (36.1 C) (Oral)   Ht '5\' 10"'$  (1.778 m)   Wt 244 lb 3.2 oz (110.8 kg)   BMI 35.04 kg/m      Assessment & Plan:  1. Essential hypertension - CMP14+EGFR  2. Gastroesophageal reflux disease, esophagitis presence not specified - CMP14+EGFR  3.  Arthritis - CMP14+EGFR  4. Morbid obesity ([place]) - CMP14+EGFR  5. GAD (generalized anxiety disorder) - CMP14+EGFR  6. Hyperlipidemia, unspecified hyperlipidemia type - CMP14+EGFR - Lipid  panel  7. Colon cancer screening - CMP14+EGFR - Ambulatory referral to Gastroenterology - Fecal occult blood, imunochemical; Future  8. Bilateral carpal tunnel syndrome Wear wrist brace nightly  - CMP14+EGFR - naproxen (NAPROSYN) 500 MG tablet; Take 1 tablet (500 mg total) by mouth 2 (two) times daily with a meal.  Dispense: 60 tablet; Refill: 1   Continue all meds Labs pending Health Maintenance reviewed Diet and exercise encouraged RTO 6 months for CPE  Evelina Dun, [name]

## 2017-05-02 ENCOUNTER — Other Ambulatory Visit: Payer: Self-pay | Admitting: Family

## 2017-05-02 LAB — LIPID PANEL
CHOLESTEROL TOTAL: 125 mg/dL (ref 100–199)
Chol/HDL Ratio: 3.2 ratio (ref 0.0–5.0)
HDL: 39 mg/dL — AB (ref >39)
LDL Calculated: 71 mg/dL (ref 0–99)
Triglycerides: 74 mg/dL (ref 0–149)
VLDL Cholesterol Cal: 15 mg/dL (ref 5–40)

## 2017-05-02 LAB — CMP14+EGFR
A/G RATIO: 2 (ref 1.2–2.2)
ALBUMIN: 5 g/dL (ref 3.5–5.5)
ALT: 48 IU/L — ABNORMAL HIGH (ref 0–44)
AST: 39 IU/L (ref 0–40)
Alkaline Phosphatase: 29 IU/L — ABNORMAL LOW (ref 39–117)
BUN/Creatinine Ratio: 13 (ref 9–20)
BUN: 20 mg/dL (ref 6–24)
Bilirubin Total: 0.3 mg/dL (ref 0.0–1.2)
CALCIUM: 9.8 mg/dL (ref 8.7–10.2)
CHLORIDE: 104 mmol/L (ref 96–106)
CO2: 23 mmol/L (ref 20–29)
Creatinine, Ser: 1.49 mg/dL — ABNORMAL HIGH (ref 0.76–1.27)
GFR calc Af Amer: 61 mL/min/{1.73_m2} (ref >59)
GFR calc non Af Amer: 53 mL/min/{1.73_m2} — ABNORMAL LOW (ref >59)
GLOBULIN, TOTAL: 2.5 g/dL (ref 1.5–4.5)
Glucose: 98 mg/dL (ref 65–99)
Potassium: 4 mmol/L (ref 3.5–5.2)
Sodium: 143 mmol/L (ref 134–144)
Total Protein: 7.5 g/dL (ref 6.0–8.5)

## 2017-05-04 ENCOUNTER — Encounter (INDEPENDENT_AMBULATORY_CARE_PROVIDER_SITE_OTHER): Payer: Self-pay | Admitting: *Deleted

## 2017-07-17 ENCOUNTER — Other Ambulatory Visit: Payer: Self-pay | Admitting: Family

## 2017-07-17 DIAGNOSIS — I10 Essential (primary) hypertension: Secondary | ICD-10-CM

## 2017-07-17 DIAGNOSIS — E785 Hyperlipidemia, unspecified: Secondary | ICD-10-CM

## 2017-07-27 ENCOUNTER — Other Ambulatory Visit: Payer: Self-pay | Admitting: Family

## 2017-07-27 DIAGNOSIS — I10 Essential (primary) hypertension: Secondary | ICD-10-CM

## 2017-08-16 ENCOUNTER — Other Ambulatory Visit: Payer: Self-pay | Admitting: Family

## 2017-10-21 ENCOUNTER — Other Ambulatory Visit: Payer: Self-pay | Admitting: Family

## 2017-10-21 DIAGNOSIS — E785 Hyperlipidemia, unspecified: Secondary | ICD-10-CM

## 2017-10-21 DIAGNOSIS — I10 Essential (primary) hypertension: Secondary | ICD-10-CM

## 2017-10-21 NOTE — Telephone Encounter (Signed)
OV 10/30/17 

## 2017-10-28 ENCOUNTER — Other Ambulatory Visit: Payer: Self-pay | Admitting: Family

## 2017-10-28 DIAGNOSIS — I10 Essential (primary) hypertension: Secondary | ICD-10-CM

## 2017-10-30 ENCOUNTER — Encounter: Payer: Self-pay | Admitting: Family

## 2017-10-30 ENCOUNTER — Ambulatory Visit: Payer: BLUE CROSS/BLUE SHIELD | Admitting: Family

## 2017-10-30 VITALS — BP 117/77 | HR 60 | Temp 97.8°F | Ht 70.0 in | Wt 241.6 lb

## 2017-10-30 DIAGNOSIS — I10 Essential (primary) hypertension: Secondary | ICD-10-CM

## 2017-10-30 DIAGNOSIS — Z1211 Encounter for screening for malignant neoplasm of colon: Secondary | ICD-10-CM

## 2017-10-30 DIAGNOSIS — E669 Obesity, unspecified: Secondary | ICD-10-CM

## 2017-10-30 DIAGNOSIS — K219 Gastro-esophageal reflux disease without esophagitis: Secondary | ICD-10-CM | POA: Diagnosis not present

## 2017-10-30 DIAGNOSIS — E785 Hyperlipidemia, unspecified: Secondary | ICD-10-CM | POA: Diagnosis not present

## 2017-10-30 DIAGNOSIS — M2550 Pain in unspecified joint: Secondary | ICD-10-CM

## 2017-10-30 DIAGNOSIS — E8881 Metabolic syndrome: Secondary | ICD-10-CM

## 2017-10-30 DIAGNOSIS — Z8249 Family history of ischemic heart disease and other diseases of the circulatory system: Secondary | ICD-10-CM

## 2017-10-30 DIAGNOSIS — M1990 Unspecified osteoarthritis, unspecified site: Secondary | ICD-10-CM

## 2017-10-30 DIAGNOSIS — F411 Generalized anxiety disorder: Secondary | ICD-10-CM | POA: Diagnosis not present

## 2017-10-30 DIAGNOSIS — Z1212 Encounter for screening for malignant neoplasm of rectum: Secondary | ICD-10-CM

## 2017-10-30 MED ORDER — OMEPRAZOLE 20 MG PO CPDR: 20.0000 mg | DELAYED_RELEASE_CAPSULE | Freq: Every day | ORAL | 2 refills | Status: DC

## 2017-10-30 MED ORDER — AMLODIPINE-VALSARTAN-HCTZ 10-320-25 MG PO TABS: 1.0000 | ORAL_TABLET | Freq: Every day | ORAL | 2 refills | Status: DC

## 2017-10-30 MED ORDER — METOPROLOL SUCCINATE ER 200 MG PO TB24: 200.0000 mg | ORAL_TABLET | Freq: Every day | ORAL | 2 refills | Status: DC

## 2017-10-30 MED ORDER — ATORVASTATIN CALCIUM 20 MG PO TABS: 20.0000 mg | ORAL_TABLET | Freq: Every day | ORAL | 2 refills | Status: DC

## 2017-10-30 NOTE — Progress Notes (Signed)
Subjective:    Patient ID: Benjamin Edwards, male    DOB: Mar 25, 1966, 52 y.o.   MRN: [id number]  Chief Complaint  Patient presents with  . Medical Management of Chronic Issues    six month recheck   PT presents to the office today for chronic follow up. Pt states he has been enjoying fishing.  Hypertension  This is a chronic problem. The current episode started more than 1 year ago. The problem has been resolved since onset. The problem is controlled. Associated symptoms include anxiety. Pertinent negatives include no malaise/fatigue, peripheral edema or shortness of breath. Risk factors for coronary artery disease include dyslipidemia, male gender, obesity and family history. The current treatment provides moderate improvement. There is no history of kidney disease, CAD/MI or heart failure.  Gastroesophageal Reflux  He reports no belching, no coughing or no heartburn. This is a chronic problem. The current episode started more than 1 year ago. The problem occurs occasionally. The problem has been waxing and waning. Risk factors include obesity. He has tried a PPI and a diet change for the symptoms. The treatment provided moderate relief.  Hyperlipidemia  This is a chronic problem. The current episode started more than 1 year ago. The problem is controlled. Recent lipid tests were reviewed and are normal. Exacerbating diseases include obesity. Pertinent negatives include no shortness of breath. Current antihyperlipidemic treatment includes statins. The current treatment provides moderate improvement of lipids. Risk factors for coronary artery disease include dyslipidemia, family history, male sex, hypertension, obesity and a sedentary lifestyle.  Anxiety  Presents for follow-up visit. Patient reports no excessive worry, irritability, nervous/anxious behavior or shortness of breath. Symptoms occur occasionally. The severity of symptoms is moderate.    Arthritis  Presents for follow-up visit. He  complains of pain and stiffness. The symptoms have been stable. Affected locations include the right MCP and left MCP (back). His pain is at a severity of 10/10.  Metabolic Syndrome Pt states he is active at work, but does not do scheduled exercise. Takes statin.     Review of Systems  Constitutional: Negative for irritability and malaise/fatigue.  Respiratory: Negative for cough and shortness of breath.   Gastrointestinal: Negative for heartburn.  Musculoskeletal: Positive for arthritis and stiffness.  Psychiatric/Behavioral: The patient is not nervous/anxious.   All other systems reviewed and are negative.      Objective:   Physical Exam  Constitutional: He is oriented to person, place, and time. He appears well-developed and well-nourished. No distress.  HENT:  Head: Normocephalic.  Right Ear: External ear normal.  Left Ear: External ear normal.  Mouth/Throat: Oropharynx is clear and moist.  Eyes: Pupils are equal, round, and reactive to light. Right eye exhibits no discharge. Left eye exhibits no discharge.  Neck: Normal range of motion. Neck supple. No thyromegaly present.  Cardiovascular: Normal rate, regular rhythm, normal heart sounds and intact distal pulses.  No murmur heard. Pulmonary/Chest: Effort normal and breath sounds normal. No respiratory distress. He has no wheezes.  Abdominal: Soft. Bowel sounds are normal. He exhibits no distension. There is no tenderness.  Musculoskeletal: Normal range of motion. He exhibits no edema or tenderness.  Neurological: He is alert and oriented to person, place, and time. He has normal reflexes. No cranial nerve deficit.  Skin: Skin is warm and dry. No rash noted. No erythema.  Psychiatric: He has a normal mood and affect. His behavior is normal. Judgment and thought content normal.  Vitals reviewed.     BP 117/77  Pulse 60   Temp 97.8 F (36.6 C) (Oral)   Ht '5\' 10"'$  (1.778 m)   Wt 241 lb 9.6 oz (109.6 kg)   BMI 34.67 kg/m       Assessment & Plan:  Kort Stettler comes in today with chief complaint of Medical Management of Chronic Issues (six month recheck)   Diagnosis and orders addressed:  1. Essential hypertension - amLODIPine-Valsartan-HCTZ 10-320-25 MG TABS; Take 1 tablet by mouth daily.  Dispense: 30 tablet; Refill: 2 - metoprolol (TOPROL-XL) 200 MG 24 hr tablet; Take 1 tablet (200 mg total) by mouth daily.  Dispense: 90 tablet; Refill: 2 - Ambulatory referral to Cardiology - CMP14+EGFR - CBC with Differential/Platelet  2. Hyperlipidemia - atorvastatin (LIPITOR) 20 MG tablet; Take 1 tablet (20 mg total) by mouth daily.  Dispense: 90 tablet; Refill: 2 - Ambulatory referral to Cardiology - CMP14+EGFR - CBC with Differential/Platelet - Lipid panel  3. GAD (generalized anxiety disorder) - CMP14+EGFR - CBC with Differential/Platelet  4. Gastroesophageal reflux disease, esophagitis presence not specified - CMP14+EGFR - CBC with Differential/Platelet  5. Metabolic syndrome - Ambulatory referral to Cardiology - CMP14+EGFR - CBC with Differential/Platelet  6. Obesity (BMI 30.0-34.9) - Ambulatory referral to Cardiology - CMP14+EGFR - CBC with Differential/Platelet  7. Arthritis - CMP14+EGFR - CBC with Differential/Platelet - Arthritis Panel  8. Colon cancer screening - CMP14+EGFR - CBC with Differential/Platelet - Ambulatory referral to Gastroenterology  9. Screening for malignant neoplasm of the rectum - CMP14+EGFR - CBC with Differential/Platelet - Ambulatory referral to Gastroenterology  10. Family history of cardiovascular disease - Ambulatory referral to Cardiology  11. Arthralgia, unspecified joint - Arthritis Panel   Labs pending Health Maintenance reviewed Diet and exercise encouraged  Follow up plan: 6 months    Evelina Dun, [name]

## 2017-10-30 NOTE — Patient Instructions (Signed)

## 2017-10-31 LAB — ARTHRITIS PANEL
Basophils Absolute: 0 10*3/uL (ref 0.0–0.2)
Basos: 0 %
EOS (ABSOLUTE): 0.2 10*3/uL (ref 0.0–0.4)
EOS: 3 %
HEMOGLOBIN: 14.6 g/dL (ref 13.0–17.7)
Hematocrit: 43 % (ref 37.5–51.0)
IMMATURE GRANS (ABS): 0 10*3/uL (ref 0.0–0.1)
Immature Granulocytes: 0 %
Lymphocytes Absolute: 2.1 10*3/uL (ref 0.7–3.1)
Lymphs: 38 %
MCH: 27.4 pg (ref 26.6–33.0)
MCHC: 34 g/dL (ref 31.5–35.7)
MCV: 81 fL (ref 79–97)
MONOCYTES: 7 %
Monocytes Absolute: 0.4 10*3/uL (ref 0.1–0.9)
NEUTROS ABS: 2.9 10*3/uL (ref 1.4–7.0)
Neutrophils: 52 %
Platelets: 194 10*3/uL (ref 150–450)
RBC: 5.32 x10E6/uL (ref 4.14–5.80)
RDW: 15.2 % (ref 12.3–15.4)
Rhuematoid fact SerPl-aCnc: <10 IU/mL (ref 0.0–13.9)
SED RATE: 7 mm/h (ref 0–30)
Uric Acid: 8.4 mg/dL (ref 3.7–8.6)
WBC: 5.7 10*3/uL (ref 3.4–10.8)

## 2017-10-31 LAB — CMP14+EGFR
ALK PHOS: 44 IU/L (ref 39–117)
ALT: 37 IU/L (ref 0–44)
AST: 27 IU/L (ref 0–40)
Albumin/Globulin Ratio: 1.9 (ref 1.2–2.2)
Albumin: 4.5 g/dL (ref 3.5–5.5)
BILIRUBIN TOTAL: 0.4 mg/dL (ref 0.0–1.2)
BUN / CREAT RATIO: 10 (ref 9–20)
BUN: 11 mg/dL (ref 6–24)
CHLORIDE: 99 mmol/L (ref 96–106)
CO2: 27 mmol/L (ref 20–29)
CREATININE: 1.1 mg/dL (ref 0.76–1.27)
Calcium: 9.7 mg/dL (ref 8.7–10.2)
GFR calc Af Amer: 88 mL/min/{1.73_m2} (ref >59)
GFR calc non Af Amer: 76 mL/min/{1.73_m2} (ref >59)
GLUCOSE: 110 mg/dL — AB (ref 65–99)
Globulin, Total: 2.4 g/dL (ref 1.5–4.5)
Potassium: 3.9 mmol/L (ref 3.5–5.2)
Sodium: 140 mmol/L (ref 134–144)
Total Protein: 6.9 g/dL (ref 6.0–8.5)

## 2017-10-31 LAB — LIPID PANEL
CHOLESTEROL TOTAL: 162 mg/dL (ref 100–199)
Chol/HDL Ratio: 4.6 ratio (ref 0.0–5.0)
HDL: 35 mg/dL — AB (ref >39)
LDL CALC: 70 mg/dL (ref 0–99)
TRIGLYCERIDES: 286 mg/dL — AB (ref 0–149)
VLDL CHOLESTEROL CAL: 57 mg/dL — AB (ref 5–40)

## 2017-11-03 ENCOUNTER — Other Ambulatory Visit: Payer: Self-pay | Admitting: Family

## 2017-11-03 MED ORDER — ICOSAPENT ETHYL 1 G PO CAPS: 2.0000 g | ORAL_CAPSULE | Freq: Two times a day (BID) | ORAL | 2 refills | Status: DC

## 2017-11-13 ENCOUNTER — Other Ambulatory Visit: Payer: Self-pay | Admitting: Family

## 2017-11-13 MED ORDER — ICOSAPENT ETHYL 1 G PO CAPS: 2.0000 g | ORAL_CAPSULE | Freq: Two times a day (BID) | ORAL | 1 refills | Status: DC

## 2017-11-23 ENCOUNTER — Encounter: Payer: Self-pay | Admitting: Cardiology

## 2017-11-23 NOTE — Progress Notes (Signed)
Cardiology Office Note  Date: 11/24/2017   ID: Benjamin Edwards Coia, DOB 1965-11-22, MRN [id number]  PCP: [name], [name], [name]  Consulting Cardiologist: [name], MD   Chief Complaint  Patient presents with  . Cardiac evaluation    History of Present Illness: Benjamin Edwards Forget is a 52 y.o. male referred for cardiology consultation by [name] NP due to cardiac risk factors and family history of premature CAD.  I reviewed the recent office note from July.  He reports no exertional chest pain or unusual shortness of breath with typical activities at this time.  He states that he has been compliant with his medications as outlined below.  Both parents developed heart disease in their 4350s.  He himself had an episode of atrial fibrillation documented at age 52, apparently had a few episodes but nothing in the last 10 to 15 years.  He has not undergone any objective ischemic testing since age 52.  He is on high-dose Toprol-XL 200 mg daily which he has tolerated long-term.  States that he has been on a beta-blocker since his diagnosis of atrial fibrillation at age 52.  He does not report any recent sense of palpitations.  His most recent lipid panel showed elevated triglycerides but good LDL control.  He is on Lipitor.  He has a bunion on his right foot which gives him some pain with ambulation, but does not report any claudication symptoms.  I personally reviewed his ECG today which shows sinus rhythm.  Past Medical History:  Diagnosis Date  . Anxiety   . History of atrial fibrillation    Documented age 52  . Hyperlipidemia   . Hypertension   . Seasonal allergies    Spring / Fall    Past Surgical History:  Procedure Laterality Date  . Skin graph left leg Left   . WISDOM TOOTH EXTRACTION      Current Outpatient Medications  Medication Sig Dispense Refill  . amLODIPine-Valsartan-HCTZ 10-320-25 MG TABS Take 1 tablet by mouth daily. 30 tablet 2  . aspirin 325 MG tablet Take 325  mg by mouth daily.    Marland Kitchen. atorvastatin (LIPITOR) 20 MG tablet Take 1 tablet (20 mg total) by mouth daily. 90 tablet 2  . Icosapent Ethyl (VASCEPA) 1 g CAPS Take 2 capsules (2 g total) by mouth 2 (two) times daily. 360 capsule 1  . metoprolol (TOPROL-XL) 200 MG 24 hr tablet Take 1 tablet (200 mg total) by mouth daily. 90 tablet 2   No current facility-administered medications for this visit.    Allergies:  Penicillins and Vicodin [hydrocodone-acetaminophen]   Social History: The patient  reports that he has never smoked. His smokeless tobacco use includes snuff. He reports that he drinks alcohol. He reports that he does not use drugs.   Family History: The patient's family history includes Colon cancer (age of onset: 7892) in his other; Heart disease in his father; Hyperlipidemia in his father; Hypertension in his father; Pancreatic cancer in his father; Stomach cancer in his father.   ROS:  Please see the history of present illness. Otherwise, complete review of systems is positive for mild right foot pain due to bunion.  All other systems are reviewed and negative.   Physical Exam: VS:  BP (!) 148/83   Pulse 66   Ht 5\' 10"  (1.778 m)   Wt 244 lb 6.4 oz (110.9 kg)   SpO2 96%   BMI 35.07 kg/m , BMI Body mass index is 35.07 kg/m.  Wt Readings from Last 3 Encounters:  11/24/17 244 lb 6.4 oz (110.9 kg)  10/30/17 241 lb 9.6 oz (109.6 kg)  05/01/17 244 lb 3.2 oz (110.8 kg)    General: Obese male, appears comfortable at rest. HEENT: Conjunctiva and lids normal, oropharynx clear. Neck: Supple, no elevated JVP or carotid bruits, no thyromegaly. Lungs: Clear to auscultation, nonlabored breathing at rest. Cardiac: Regular rate and rhythm, no S3 or significant systolic murmur, no pericardial rub. Abdomen: Soft, nontender, bowel sounds present. Extremities: No pitting edema, distal pulses 2+. Skin: Warm and dry. Musculoskeletal: No kyphosis. Neuropsychiatric: Alert and oriented x3, affect  grossly appropriate.  ECG: I personally reviewed the tracing from 05/04/2015 which showed sinus rhythm.  Recent Labwork: 10/30/2017: ALT 37; AST 27; BUN 11; Creatinine, Ser 1.10; Hemoglobin 14.6; Platelets 194; Potassium 3.9; Sodium 140     Component Value Date/Time   CHOL 162 10/30/2017 1220   TRIG 286 (H) 10/30/2017 1220   HDL 35 (L) 10/30/2017 1220   CHOLHDL 4.6 10/30/2017 1220   LDLCALC 70 10/30/2017 1220    Other Studies Reviewed Today:  Chest x-ray 05/04/2015: FINDINGS: The heart size and mediastinal contours are within normal limits. Both lungs are clear. The visualized skeletal structures are unremarkable.  IMPRESSION: No active cardiopulmonary disease.  Assessment and Plan:  1.  Cardiac evaluation in a 52 year old male with history of hypertension, hyperlipidemia, and previously documented atrial fibrillation.  He has a family history of premature CAD with both parents developing heart disease in their early 2550s.  He has not undergone any ischemic testing since age 52.  At this point reports no definite angina symptoms.  He is on relatively high-dose beta-blocker for treatment of hypertension as well as prior documented atrial fibrillation.  Rather than holding this medication for a treadmill, we will proceed with a Lexiscan Myoview for further ischemic evaluation.  2.  Essential hypertension, no changes made to present regimen.  Keep follow-up with PCP.  3.  Mixed hyperlipidemia, on Lipitor.  Recent LDL 70.  4.  Remote history of atrial fibrillation, no major recurrences. CHADSVASC score is 1.  Would continue observation for now, no clear indication to start anticoagulation unless he has significant recurrences.  Current medicines were reviewed with the patient today.   Orders Placed This Encounter  Procedures  . NM Myocar Multi W/Spect W/Wall Motion / EF  . EKG 12-Lead    Disposition: Call with test results.  Signed, Jonelle SidleSamuel G. McDowell, MD, [facility] 11/24/2017  3:05 PM    [facility] [address], [place], [place] [address] Phone: [phone]) [phone]; Fax: [phone]

## 2017-11-24 ENCOUNTER — Encounter: Payer: Self-pay | Admitting: Cardiology

## 2017-11-24 ENCOUNTER — Ambulatory Visit: Payer: BLUE CROSS/BLUE SHIELD | Admitting: Cardiology

## 2017-11-24 ENCOUNTER — Telehealth: Payer: Self-pay | Admitting: Cardiology

## 2017-11-24 VITALS — BP 148/83 | HR 66 | Ht 70.0 in | Wt 244.4 lb

## 2017-11-24 DIAGNOSIS — I10 Essential (primary) hypertension: Secondary | ICD-10-CM

## 2017-11-24 DIAGNOSIS — Z8249 Family history of ischemic heart disease and other diseases of the circulatory system: Secondary | ICD-10-CM | POA: Diagnosis not present

## 2017-11-24 DIAGNOSIS — Z9189 Other specified personal risk factors, not elsewhere classified: Secondary | ICD-10-CM

## 2017-11-24 DIAGNOSIS — E782 Mixed hyperlipidemia: Secondary | ICD-10-CM

## 2017-11-24 DIAGNOSIS — Z8679 Personal history of other diseases of the circulatory system: Secondary | ICD-10-CM | POA: Diagnosis not present

## 2017-11-24 NOTE — Patient Instructions (Signed)
Medication Instructions:  Your physician recommends that you continue on your current medications as directed. Please refer to the Current Medication list given to you today.  Labwork: NONE  Testing/Procedures: Your physician has requested that you have a lexiscan myoview. For further information please visit [email or web address]. Please follow instruction sheet, as given.  Follow-Up: Your physician recommends that you schedule a follow-up appointment PENDING TEST RESULTS  Any Other Special Instructions Will Be Listed Below (If Applicable).  If you need a refill on your cardiac medications before your next appointment, please call your pharmacy. 

## 2017-11-24 NOTE — Telephone Encounter (Signed)
Pre-cert Verification for the following procedure   Lexi- hx afib  Scheduled for 11-30-2017 at [facility]

## 2017-11-30 ENCOUNTER — Encounter (HOSPITAL_COMMUNITY): Payer: BLUE CROSS/BLUE SHIELD

## 2017-12-31 ENCOUNTER — Encounter: Payer: Self-pay | Admitting: Family

## 2018-03-11 ENCOUNTER — Other Ambulatory Visit: Payer: Self-pay | Admitting: Family

## 2018-03-11 DIAGNOSIS — I10 Essential (primary) hypertension: Secondary | ICD-10-CM

## 2018-04-07 HISTORY — PX: KNEE SURGERY: SHX244

## 2018-05-03 ENCOUNTER — Ambulatory Visit: Payer: BLUE CROSS/BLUE SHIELD | Admitting: Family

## 2018-05-03 ENCOUNTER — Encounter: Payer: Self-pay | Admitting: Family

## 2018-05-03 VITALS — BP 110/65 | HR 60 | Temp 98.2°F | Ht 70.0 in | Wt 250.6 lb

## 2018-05-03 DIAGNOSIS — E8881 Metabolic syndrome: Secondary | ICD-10-CM

## 2018-05-03 DIAGNOSIS — M1990 Unspecified osteoarthritis, unspecified site: Secondary | ICD-10-CM

## 2018-05-03 DIAGNOSIS — Z0001 Encounter for general adult medical examination with abnormal findings: Secondary | ICD-10-CM

## 2018-05-03 DIAGNOSIS — Z1211 Encounter for screening for malignant neoplasm of colon: Secondary | ICD-10-CM

## 2018-05-03 DIAGNOSIS — K219 Gastro-esophageal reflux disease without esophagitis: Secondary | ICD-10-CM | POA: Diagnosis not present

## 2018-05-03 DIAGNOSIS — F411 Generalized anxiety disorder: Secondary | ICD-10-CM

## 2018-05-03 DIAGNOSIS — Z0000 Encounter for general adult medical examination without abnormal findings: Secondary | ICD-10-CM

## 2018-05-03 DIAGNOSIS — I10 Essential (primary) hypertension: Secondary | ICD-10-CM

## 2018-05-03 DIAGNOSIS — E785 Hyperlipidemia, unspecified: Secondary | ICD-10-CM

## 2018-05-03 NOTE — Patient Instructions (Signed)

## 2018-05-03 NOTE — Progress Notes (Signed)
Subjective:    Patient ID: Benjamin Edwards, male    DOB: June 03, 1965, 53 y.o.   MRN: [id number]  Chief Complaint  Patient presents with  . Medical Management of Chronic Issues    six month recheck   Pt presents to the office today for CPE. Pt states he quit dipping three weeks ago.  Hypertension  This is a chronic problem. The current episode started more than 1 year ago. The problem has been resolved since onset. The problem is controlled. Pertinent negatives include no headaches, malaise/fatigue, peripheral edema or shortness of breath. Risk factors for coronary artery disease include dyslipidemia, obesity and male gender. The current treatment provides moderate improvement. There is no history of CAD/MI or heart failure.  Gastroesophageal Reflux  He complains of heartburn. He reports no belching or no coughing. This is a chronic problem. The current episode started more than 1 year ago. The problem occurs occasionally. The problem has been waxing and waning. He has tried a PPI for the symptoms. The treatment provided moderate relief.  Arthritis  Presents for follow-up visit. He complains of pain and stiffness. The symptoms have been stable. Affected locations include the right MCP, left MCP, right knee, left knee, left shoulder and right shoulder (back). His pain is at a severity of 7/10.  Hyperlipidemia  This is a chronic problem. The current episode started more than 1 year ago. The problem is controlled. Recent lipid tests were reviewed and are normal. Exacerbating diseases include obesity. Pertinent negatives include no shortness of breath. Current antihyperlipidemic treatment includes herbal therapy. The current treatment provides mild improvement of lipids. Risk factors for coronary artery disease include dyslipidemia, male sex, hypertension, obesity and a sedentary lifestyle.  Metabolic Syndrome Pt does not do any scheduled exercising and eats what he wants. He does take his Lipitor  daily.    Review of Systems  Constitutional: Negative for malaise/fatigue.  Respiratory: Negative for cough and shortness of breath.   Gastrointestinal: Positive for heartburn.  Musculoskeletal: Positive for arthritis and stiffness.  Neurological: Negative for headaches.  All other systems reviewed and are negative.  Family History  Problem Relation Age of Onset  . Heart disease Father   . Hyperlipidemia Father   . Hypertension Father   . Pancreatic cancer Father        started in pancreas, mets to stomach   . Stomach cancer Father   . Colon cancer Other 92  . Colon polyps Neg Hx   . Rectal cancer Neg Hx     Social History   Socioeconomic History  . Marital status: Married    Spouse name: Not on file  . Number of children: Not on file  . Years of education: Not on file  . Highest education level: Not on file  Occupational History  . Not on file  Social Needs  . Financial resource strain: Not on file  . Food insecurity:    Worry: Not on file    Inability: Not on file  . Transportation needs:    Medical: Not on file    Non-medical: Not on file  Tobacco Use  . Smoking status: Never Smoker  . Smokeless tobacco: Former Systems developer    Types: Snuff  Substance and Sexual Activity  . Alcohol use: Yes    Comment: occ 2 beers a day   . Drug use: No  . Sexual activity: Yes  Lifestyle  . Physical activity:    Days per week: Not on file  Minutes per session: Not on file  . Stress: Not on file  Relationships  . Social connections:    Talks on phone: Not on file    Gets together: Not on file    Attends religious service: Not on file    Active member of club or organization: Not on file    Attends meetings of clubs or organizations: Not on file    Relationship status: Not on file  Other Topics Concern  . Not on file  Social History Narrative  . Not on file       Objective:   Physical Exam Vitals signs reviewed.  Constitutional:      General: He is not in acute  distress.    Appearance: He is well-developed.  HENT:     Head: Normocephalic.     Right Ear: Tympanic membrane and external ear normal.     Left Ear: Tympanic membrane and external ear normal.  Eyes:     General:        Right eye: No discharge.        Left eye: No discharge.     Pupils: Pupils are equal, round, and reactive to light.  Neck:     Musculoskeletal: Normal range of motion and neck supple.     Thyroid: No thyromegaly.  Cardiovascular:     Rate and Rhythm: Normal rate and regular rhythm.     Heart sounds: Normal heart sounds. No murmur.  Pulmonary:     Effort: Pulmonary effort is normal. No respiratory distress.     Breath sounds: Normal breath sounds. No wheezing.  Abdominal:     General: Bowel sounds are normal. There is no distension.     Palpations: Abdomen is soft.     Tenderness: There is no abdominal tenderness.  Musculoskeletal: Normal range of motion.        General: No tenderness.  Skin:    General: Skin is warm and dry.     Findings: No erythema or rash.  Neurological:     Mental Status: He is alert and oriented to person, place, and time.     Cranial Nerves: No cranial nerve deficit.     Deep Tendon Reflexes: Reflexes are normal and symmetric.  Psychiatric:        Behavior: Behavior normal.        Thought Content: Thought content normal.        Judgment: Judgment normal.       BP 110/65   Pulse 60   Temp 98.2 F (36.8 C) (Oral)   Ht '5\' 10"'$  (1.778 m)   Wt 250 lb 9.6 oz (113.7 kg)   BMI 35.96 kg/m      Assessment & Plan:  Benjamin Edwards comes in today with chief complaint of Medical Management of Chronic Issues (six month recheck)   Diagnosis and orders addressed:  1. Annual physical exam - CMP14+EGFR; Future - CBC with Differential/Platelet; Future - Lipid panel; Future - PSA, total and free; Future - TSH; Future  2. Essential hypertension - CMP14+EGFR; Future - CBC with Differential/Platelet; Future  3. Gastroesophageal  reflux disease, esophagitis presence not specified - CMP14+EGFR; Future - CBC with Differential/Platelet; Future  4. Arthritis - CMP14+EGFR; Future - CBC with Differential/Platelet; Future  5. Hyperlipidemia, unspecified hyperlipidemia type - CMP14+EGFR; Future - CBC with Differential/Platelet; Future  6. Metabolic syndrome - UXL24+MWNU; Future - CBC with Differential/Platelet; Future  7. GAD (generalized anxiety disorder) - CMP14+EGFR; Future - CBC with Differential/Platelet; Future  8.  Morbid obesity (Sebastian) - CMP14+EGFR; Future - CBC with Differential/Platelet; Future  9. Colon cancer screening - Ambulatory referral to Gastroenterology   Labs pending Health Maintenance reviewed Diet and exercise encouraged  Follow up plan: 6 months    Evelina Dun, [name]

## 2018-06-04 ENCOUNTER — Other Ambulatory Visit: Payer: Self-pay | Admitting: Family

## 2018-06-04 DIAGNOSIS — I10 Essential (primary) hypertension: Secondary | ICD-10-CM

## 2018-09-20 ENCOUNTER — Encounter: Payer: Self-pay | Admitting: Family

## 2018-09-20 ENCOUNTER — Ambulatory Visit (INDEPENDENT_AMBULATORY_CARE_PROVIDER_SITE_OTHER): Payer: BC Managed Care – PPO

## 2018-09-20 ENCOUNTER — Ambulatory Visit (INDEPENDENT_AMBULATORY_CARE_PROVIDER_SITE_OTHER): Payer: BC Managed Care – PPO | Admitting: Family

## 2018-09-20 ENCOUNTER — Other Ambulatory Visit: Payer: Self-pay

## 2018-09-20 VITALS — BP 122/78 | HR 66 | Temp 98.6°F | Ht 70.0 in | Wt 249.6 lb

## 2018-09-20 DIAGNOSIS — M1711 Unilateral primary osteoarthritis, right knee: Secondary | ICD-10-CM | POA: Diagnosis not present

## 2018-09-20 DIAGNOSIS — M25561 Pain in right knee: Secondary | ICD-10-CM

## 2018-09-20 MED ORDER — PREDNISONE 10 MG (21) PO TBPK: ORAL_TABLET | ORAL | 0 refills | Status: DC

## 2018-09-20 MED ORDER — DICLOFENAC SODIUM 75 MG PO TBEC: 75.0000 mg | DELAYED_RELEASE_TABLET | Freq: Two times a day (BID) | ORAL | 0 refills | Status: DC

## 2018-09-20 NOTE — Patient Instructions (Signed)
Acute Knee Pain, Adult  Acute knee pain is sudden and may be caused by damage, swelling, or irritation of the muscles and tissues that support your knee. The injury may result from:   A fall.   An injury to your knee from twisting motions.   A hit to the knee.   Infection.  Acute knee pain may go away on its own with time and rest. If it does not, your health care provider may order tests to find the cause of the pain. These may include:   Imaging tests, such as an X-ray, MRI, or ultrasound.   Joint aspiration. In this test, fluid is removed from the knee.   Arthroscopy. In this test, a lighted tube is inserted into the knee and an image is projected onto a TV screen.   Biopsy. In this test, a sample of tissue is removed from the body and studied under a microscope.  Follow these instructions at home:  Pay attention to any changes in your symptoms. Take these actions to relieve your pain.  If you have a knee sleeve or brace:     Wear the sleeve or brace as told by your health care provider. Remove it only as told by your health care provider.   Loosen the sleeve or brace if your toes tingle, become numb, or turn cold and blue.   Keep the sleeve or brace clean.   If the sleeve or brace is not waterproof:  ? Do not let it get wet.  ? Cover it with a watertight covering when you take a bath or shower.  Activity   Rest your knee.   Do not do things that cause pain or make pain worse.   Avoid high-impact activities or exercises, such as running, jumping rope, or doing jumping jacks.   Work with a physical therapist to make a safe exercise program, as recommended by your health care provider. Do exercises as told by your physical therapist.  Managing pain, stiffness, and swelling     If directed, put ice on the knee:  ? Put ice in a plastic bag.  ? Place a towel between your skin and the bag.  ? Leave the ice on for 20 minutes, 2-3 times a day.   If directed, use an elastic bandage to put pressure  (compression) on your injured knee. This may control swelling, give support, and help with discomfort.  General instructions   Take over-the-counter and prescription medicines only as told by your health care provider.   Raise (elevate) your knee above the level of your heart when you are sitting or lying down.   Sleep with a pillow under your knee.   Do not use any products that contain nicotine or tobacco, such as cigarettes, e-cigarettes, and chewing tobacco. These can delay healing. If you need help quitting, ask your health care provider.   If you are overweight, work with your health care provider and a dietitian to set a weight-loss goal that is healthy and reasonable for you. Extra weight can put pressure on your knee.   Keep all follow-up visits as told by your health care provider. This is important.  Contact a health care provider if:   Your knee pain continues, changes, or gets worse.   You have a fever along with knee pain.   Your knee feels warm to the touch.   Your knee buckles or locks up.  Get help right away if:   Your knee swells,   and the swelling becomes worse.   You cannot move your knee.   You have severe pain in your knee.  Summary   Acute knee pain can be caused by a fall, an injury, an infection, or damage, swelling, or irritation of the tissues that support your knee.   Your health care provider may perform tests to find out the cause of the pain.   Pay attention to any changes in your symptoms. Relieve your pain with rest, medicines, light activity, and use of ice.   Get help if your pain continues or becomes worse, your knee swells, or you cannot move your knee.  This information is not intended to replace advice given to you by your health care provider. Make sure you discuss any questions you have with your health care provider.  Document Released: 01/19/2007 Document Revised: 09/03/2017 Document Reviewed: 09/03/2017  Elsevier Interactive Patient Education  2019  [employer]vier Inc[employer]

## 2018-09-20 NOTE — Progress Notes (Signed)
Subjective:    Patient ID: Marti SleighMichael Stantz, male    DOB: 10/16/1965, 53 y.o.   MRN: [id number]  Chief Complaint  Patient presents with  . right knee pain    Knee Pain  The incident occurred more than 1 week ago. There was no injury mechanism. The pain is present in the right knee. The quality of the pain is described as aching. The pain is at a severity of 10/10. The pain is moderate. The pain has been constant since onset. Associated symptoms include a loss of motion. Pertinent negatives include no loss of sensation, numbness or tingling. He reports no foreign bodies present. The symptoms are aggravated by movement and weight bearing. He has tried non-weight bearing, rest and NSAIDs for the symptoms. The treatment provided mild relief.      Review of Systems  Neurological: Negative for tingling and numbness.  All other systems reviewed and are negative.      Objective:   Physical Exam Vitals signs reviewed.  Constitutional:      General: He is not in acute distress.    Appearance: He is well-developed.  HENT:     Head: Normocephalic.     Right Ear: Tympanic membrane normal.     Left Ear: Tympanic membrane normal.  Eyes:     General:        Right eye: No discharge.        Left eye: No discharge.     Pupils: Pupils are equal, round, and reactive to light.  Neck:     Musculoskeletal: Normal range of motion and neck supple.     Thyroid: No thyromegaly.  Cardiovascular:     Rate and Rhythm: Normal rate and regular rhythm.     Heart sounds: Normal heart sounds. No murmur.  Pulmonary:     Effort: Pulmonary effort is normal. No respiratory distress.     Breath sounds: Normal breath sounds. No wheezing.  Abdominal:     General: Bowel sounds are normal. There is no distension.     Palpations: Abdomen is soft.     Tenderness: There is no abdominal tenderness.  Musculoskeletal:        General: Tenderness (right medial knee pain, slight swelling, pain with flexion and  extension) present.  Skin:    General: Skin is warm and dry.     Findings: No erythema or rash.  Neurological:     Mental Status: He is alert and oriented to person, place, and time.     Cranial Nerves: No cranial nerve deficit.     Deep Tendon Reflexes: Reflexes are normal and symmetric.  Psychiatric:        Behavior: Behavior normal.        Thought Content: Thought content normal.        Judgment: Judgment normal.     BP 122/78   Pulse 66   Temp 98.6 F (37 C) (Oral)   Ht 5\' 10"  (1.778 m)   Wt 249 lb 9.6 oz (113.2 kg)   BMI 35.81 kg/m      Assessment & Plan:  Marti SleighMichael Tomkins comes in today with chief complaint of right knee pain   Diagnosis and orders addressed:  1. Acute pain of right knee Rest Ice ROM exercises  Will order MRI since pain is a 10 out 10 over the last few weeks and NSAID's not helping RTO if symptoms worsens or do not improve, depending on MRI will send to Ortho - DG Knee 1-2 Views  Right; Future - predniSONE (STERAPRED UNI-PAK 21 TAB) 10 MG (21) TBPK tablet; Use as directed  Dispense: 21 tablet; Refill: 0 - diclofenac (VOLTAREN) 75 MG EC tablet; Take 1 tablet (75 mg total) by mouth 2 (two) times daily.  Dispense: 30 tablet; Refill: 0 - MR Knee Left  Wo Contrast; Future   Evelina Dun, [name]

## 2018-09-28 ENCOUNTER — Other Ambulatory Visit: Payer: Self-pay | Admitting: Family

## 2018-09-28 DIAGNOSIS — M25561 Pain in right knee: Secondary | ICD-10-CM

## 2018-10-05 ENCOUNTER — Other Ambulatory Visit: Payer: Self-pay | Admitting: Family

## 2018-10-05 ENCOUNTER — Other Ambulatory Visit: Payer: Self-pay

## 2018-10-05 ENCOUNTER — Ambulatory Visit (HOSPITAL_COMMUNITY)
Admission: RE | Admit: 2018-10-05 | Discharge: 2018-10-05 | Disposition: A | Discharge status: Home / Self Care | Payer: BC Managed Care – PPO | Source: Ambulatory Visit | Attending: Family | Admitting: Family

## 2018-10-05 DIAGNOSIS — M25561 Pain in right knee: Secondary | ICD-10-CM

## 2018-10-07 ENCOUNTER — Other Ambulatory Visit: Payer: Self-pay | Admitting: Family

## 2018-10-07 MED ORDER — TRAMADOL HCL 50 MG PO TABS: 50.0000 mg | ORAL_TABLET | Freq: Four times a day (QID) | ORAL | 0 refills | Status: DC | PRN

## 2018-10-28 ENCOUNTER — Other Ambulatory Visit: Payer: Self-pay | Admitting: Family

## 2018-10-28 DIAGNOSIS — M25561 Pain in right knee: Secondary | ICD-10-CM

## 2018-10-28 MED ORDER — DICLOFENAC SODIUM 75 MG PO TBEC: 75.0000 mg | DELAYED_RELEASE_TABLET | Freq: Two times a day (BID) | ORAL | 2 refills | Status: DC

## 2018-11-08 ENCOUNTER — Other Ambulatory Visit: Payer: Self-pay | Admitting: Family

## 2018-11-10 NOTE — Telephone Encounter (Signed)
Please advise when back in the office  

## 2018-11-12 DIAGNOSIS — S83241A Other tear of medial meniscus, current injury, right knee, initial encounter: Secondary | ICD-10-CM | POA: Diagnosis not present

## 2018-11-12 DIAGNOSIS — M25561 Pain in right knee: Secondary | ICD-10-CM | POA: Diagnosis not present

## 2018-12-07 DIAGNOSIS — M948X6 Other specified disorders of cartilage, lower leg: Secondary | ICD-10-CM | POA: Diagnosis not present

## 2018-12-07 DIAGNOSIS — S83231A Complex tear of medial meniscus, current injury, right knee, initial encounter: Secondary | ICD-10-CM | POA: Diagnosis not present

## 2018-12-07 DIAGNOSIS — M94261 Chondromalacia, right knee: Secondary | ICD-10-CM | POA: Diagnosis not present

## 2018-12-08 DIAGNOSIS — S83231D Complex tear of medial meniscus, current injury, right knee, subsequent encounter: Secondary | ICD-10-CM | POA: Diagnosis not present

## 2018-12-14 ENCOUNTER — Other Ambulatory Visit: Payer: Self-pay | Admitting: Family

## 2018-12-14 DIAGNOSIS — E785 Hyperlipidemia, unspecified: Secondary | ICD-10-CM

## 2018-12-14 DIAGNOSIS — I10 Essential (primary) hypertension: Secondary | ICD-10-CM

## 2019-01-06 ENCOUNTER — Other Ambulatory Visit: Payer: Self-pay | Admitting: Family

## 2019-01-06 DIAGNOSIS — E785 Hyperlipidemia, unspecified: Secondary | ICD-10-CM

## 2019-01-06 DIAGNOSIS — I10 Essential (primary) hypertension: Secondary | ICD-10-CM

## 2019-02-01 ENCOUNTER — Other Ambulatory Visit: Payer: Self-pay | Admitting: Family

## 2019-02-01 DIAGNOSIS — E785 Hyperlipidemia, unspecified: Secondary | ICD-10-CM

## 2019-02-01 DIAGNOSIS — I10 Essential (primary) hypertension: Secondary | ICD-10-CM

## 2019-02-05 ENCOUNTER — Other Ambulatory Visit: Payer: Self-pay | Admitting: Family

## 2019-02-05 DIAGNOSIS — M25561 Pain in right knee: Secondary | ICD-10-CM

## 2019-02-13 ENCOUNTER — Other Ambulatory Visit: Payer: Self-pay | Admitting: Family

## 2019-02-13 DIAGNOSIS — I10 Essential (primary) hypertension: Secondary | ICD-10-CM

## 2019-02-13 DIAGNOSIS — E785 Hyperlipidemia, unspecified: Secondary | ICD-10-CM

## 2019-02-25 ENCOUNTER — Other Ambulatory Visit: Payer: Self-pay | Admitting: *Deleted

## 2019-02-25 DIAGNOSIS — E785 Hyperlipidemia, unspecified: Secondary | ICD-10-CM

## 2019-02-25 DIAGNOSIS — I10 Essential (primary) hypertension: Secondary | ICD-10-CM

## 2019-02-25 MED ORDER — ATORVASTATIN CALCIUM 20 MG PO TABS: 20.0000 mg | ORAL_TABLET | Freq: Every day | ORAL | 1 refills | Status: DC

## 2019-02-25 MED ORDER — AMLODIPINE-VALSARTAN-HCTZ 10-320-25 MG PO TABS: 1.0000 | ORAL_TABLET | Freq: Every day | ORAL | 1 refills | Status: DC

## 2019-02-25 MED ORDER — METOPROLOL SUCCINATE ER 200 MG PO TB24: 200.0000 mg | ORAL_TABLET | Freq: Every day | ORAL | 1 refills | Status: DC

## 2019-03-28 ENCOUNTER — Other Ambulatory Visit: Payer: Self-pay | Admitting: Family

## 2019-03-28 DIAGNOSIS — M25561 Pain in right knee: Secondary | ICD-10-CM

## 2019-03-28 DIAGNOSIS — E785 Hyperlipidemia, unspecified: Secondary | ICD-10-CM

## 2019-03-28 DIAGNOSIS — I10 Essential (primary) hypertension: Secondary | ICD-10-CM

## 2019-03-28 MED ORDER — AMLODIPINE-VALSARTAN-HCTZ 10-320-25 MG PO TABS: 1.0000 | ORAL_TABLET | Freq: Every day | ORAL | 1 refills | Status: DC

## 2019-03-28 MED ORDER — METOPROLOL SUCCINATE ER 200 MG PO TB24: 200.0000 mg | ORAL_TABLET | Freq: Every day | ORAL | 1 refills | Status: DC

## 2019-03-28 MED ORDER — DICLOFENAC SODIUM 75 MG PO TBEC: 75.0000 mg | DELAYED_RELEASE_TABLET | Freq: Two times a day (BID) | ORAL | 2 refills | Status: DC

## 2019-03-28 MED ORDER — ATORVASTATIN CALCIUM 20 MG PO TABS: 20.0000 mg | ORAL_TABLET | Freq: Every day | ORAL | 1 refills | Status: DC

## 2019-04-10 DIAGNOSIS — Z20828 Contact with and (suspected) exposure to other viral communicable diseases: Secondary | ICD-10-CM | POA: Diagnosis not present

## 2019-04-12 ENCOUNTER — Telehealth: Payer: BC Managed Care – PPO | Admitting: Family

## 2019-04-12 DIAGNOSIS — J069 Acute upper respiratory infection, unspecified: Secondary | ICD-10-CM

## 2019-04-12 MED ORDER — PROMETHAZINE-DM 6.25-15 MG/5ML PO SYRP: 5.0000 mL | ORAL_SOLUTION | Freq: Four times a day (QID) | ORAL | 0 refills | Status: DC | PRN

## 2019-04-12 NOTE — Progress Notes (Signed)
We are sorry you are not feeling well.  Here is how we plan to help!  Based on what you have shared with me, it looks like you may have a viral upper respiratory infection.  Upper respiratory infections are caused by a large number of viruses; however, rhinovirus is the most common cause.   Symptoms vary from person to person, with common symptoms including sore throat, cough, fatigue or lack of energy and feeling of general discomfort.  A low-grade fever of up to 100.4 may present, but is often uncommon.  Symptoms vary however, and are closely related to a person's age or underlying illnesses.  The most common symptoms associated with an upper respiratory infection are nasal discharge or congestion, cough, sneezing, headache and pressure in the ears and face.  These symptoms usually persist for about 3 to 10 days, but can last up to 2 weeks.  It is important to know that upper respiratory infections do not cause serious illness or complications in most cases.    Upper respiratory infections can be transmitted from person to person, with the most common method of transmission being a person's hands.  The virus is able to live on the skin and can infect other persons for up to 2 hours after direct contact.  Also, these can be transmitted when someone coughs or sneezes; thus, it is important to cover the mouth to reduce this risk.  To keep the spread of the illness at [place], good hand hygiene is very important.  This is an infection that is most likely caused by a virus. There are no specific treatments other than to help you with the symptoms until the infection runs its course.  We are sorry you are not feeling well.  Here is how we plan to help!   For nasal congestion, you may use an oral decongestants such as Mucinex D or if you have glaucoma or high blood pressure use plain Mucinex.  Saline nasal spray or nasal drops can help and can safely be used as often as needed for congestion.  For your congestion,  I have prescribed Promethazine DM cough medication that will help with cough and congestion.  If you do not have a history of heart disease, hypertension, diabetes or thyroid disease, prostate/bladder issues or glaucoma, you may also use Sudafed to treat nasal congestion.  It is highly recommended that you consult with a pharmacist or your primary care physician to ensure this medication is safe for you to take.     If you have a cough, you may use cough suppressants such as Delsym and Robitussin.  If you have glaucoma or high blood pressure, you can also use Coricidin HBP.     If you have a sore or scratchy throat, use a saltwater gargle-  to  teaspoon of salt dissolved in a 4-ounce to 8-ounce glass of warm water.  Gargle the solution for approximately 15-30 seconds and then spit.  It is important not to swallow the solution.  You can also use throat lozenges/cough drops and Chloraseptic spray to help with throat pain or discomfort.  Warm or cold liquids can also be helpful in relieving throat pain.  For headache, pain or general discomfort, you can use Ibuprofen or Tylenol as directed.   Some authorities believe that zinc sprays or the use of Echinacea may shorten the course of your symptoms.   HOME CARE . Only take medications as instructed by your medical team. . Be sure to drink  plenty of fluids. Water is fine as well as fruit juices, sodas and electrolyte beverages. You may want to stay away from caffeine or alcohol. If you are nauseated, try taking small sips of liquids. How do you know if you are getting enough fluid? Your urine should be a pale yellow or almost colorless. . Get rest. . Taking a steamy shower or using a humidifier may help nasal congestion and ease sore throat pain. You can place a towel over your head and breathe in the steam from hot water coming from a faucet. . Using a saline nasal spray works much the same way. . Cough drops, hard candies and sore throat lozenges  may ease your cough. . Avoid close contacts especially the very young and the elderly . Cover your mouth if you cough or sneeze . Always remember to wash your hands.   GET HELP RIGHT AWAY IF: . You develop worsening fever. . If your symptoms do not improve within 10 days . You develop yellow or green discharge from your nose over 3 days. . You have coughing fits . You develop a severe head ache or visual changes. . You develop shortness of breath, difficulty breathing or start having chest pain . Your symptoms persist after you have completed your treatment plan  MAKE SURE YOU   Understand these instructions.  Will watch your condition.  Will get help right away if you are not doing well or get worse.  Your e-visit answers were reviewed by a board certified advanced clinical practitioner to complete your personal care plan. Depending upon the condition, your plan could have included both over the counter or prescription medications. Please review your pharmacy choice. If there is a problem, you may call our nursing hot line at and have the prescription routed to another pharmacy. Your safety is important to [place]. If you have drug allergies check your prescription carefully.   You can use MyChart to ask questions about today's visit, request a non-urgent call back, or ask for a work or school excuse for 24 hours related to this e-Visit. If it has been greater than 24 hours you will need to follow up with your provider, or enter a new e-Visit to address those concerns. You will get an e-mail in the next two days asking about your experience.  I hope that your e-visit has been valuable and will speed your recovery. Thank you for using e-visits.     Greater than 5 minutes, yet less than 10 minutes of time have been spent researching, coordinating, and implementing care for this patient today.  Thank you for the details you included in the comment boxes. Those details are very helpful in  determining the best course of treatment for you and help [place] to provide the best care.

## 2019-05-05 ENCOUNTER — Ambulatory Visit: Payer: BLUE CROSS/BLUE SHIELD | Admitting: Family

## 2019-05-05 ENCOUNTER — Other Ambulatory Visit: Payer: Self-pay

## 2019-05-05 ENCOUNTER — Telehealth: Payer: Self-pay | Admitting: Family

## 2019-05-06 ENCOUNTER — Encounter: Payer: Self-pay | Admitting: Family

## 2019-05-06 ENCOUNTER — Other Ambulatory Visit: Payer: Self-pay

## 2019-05-06 ENCOUNTER — Ambulatory Visit: Payer: BC Managed Care – PPO | Admitting: Family

## 2019-05-06 ENCOUNTER — Other Ambulatory Visit: Payer: Self-pay | Admitting: Family Medicine

## 2019-05-06 VITALS — BP 121/78 | HR 67 | Temp 97.5°F | Ht 70.0 in | Wt 244.2 lb

## 2019-05-06 DIAGNOSIS — I10 Essential (primary) hypertension: Secondary | ICD-10-CM

## 2019-05-06 DIAGNOSIS — Z1211 Encounter for screening for malignant neoplasm of colon: Secondary | ICD-10-CM

## 2019-05-06 DIAGNOSIS — E669 Obesity, unspecified: Secondary | ICD-10-CM | POA: Diagnosis not present

## 2019-05-06 DIAGNOSIS — Z0000 Encounter for general adult medical examination without abnormal findings: Secondary | ICD-10-CM

## 2019-05-06 DIAGNOSIS — M1990 Unspecified osteoarthritis, unspecified site: Secondary | ICD-10-CM

## 2019-05-06 DIAGNOSIS — E8881 Metabolic syndrome: Secondary | ICD-10-CM

## 2019-05-06 DIAGNOSIS — K219 Gastro-esophageal reflux disease without esophagitis: Secondary | ICD-10-CM

## 2019-05-06 DIAGNOSIS — E785 Hyperlipidemia, unspecified: Secondary | ICD-10-CM

## 2019-05-06 DIAGNOSIS — E66811 Obesity, class 1: Secondary | ICD-10-CM

## 2019-05-06 DIAGNOSIS — Z1212 Encounter for screening for malignant neoplasm of rectum: Secondary | ICD-10-CM

## 2019-05-06 DIAGNOSIS — F411 Generalized anxiety disorder: Secondary | ICD-10-CM

## 2019-05-06 NOTE — Progress Notes (Signed)
Subjective:    Patient ID: Benjamin Edwards, male    DOB: 14-Dec-1965, 54 y.o.   MRN: [id number]  Chief Complaint  Patient presents with  . Medical Management of Chronic Issues   Pt presents to the office today for CPE. Hypertension This is a chronic problem. The current episode started more than 1 year ago. The problem has been resolved since onset. The problem is controlled. Pertinent negatives include no headaches, malaise/fatigue or peripheral edema. The current treatment provides moderate improvement.  Gastroesophageal Reflux He complains of belching and heartburn. This is a chronic problem. The current episode started more than 1 year ago. The problem occurs rarely. The problem has been waxing and waning. He has tried a diet change for the symptoms. The treatment provided moderate relief.  Arthritis Presents for follow-up visit. He complains of pain and joint swelling. The symptoms have been stable. Affected locations include the right shoulder, left shoulder, right knee and left knee. His pain is at a severity of 3/10.  Hyperlipidemia This is a chronic problem. The current episode started more than 1 year ago. The problem is controlled. Recent lipid tests were reviewed and are normal. Current antihyperlipidemic treatment includes statins. The current treatment provides moderate improvement of lipids. Risk factors for coronary artery disease include a sedentary lifestyle, male sex, hypertension and dyslipidemia.  Metabolic Syndrome Pt does not do any scheduled exercise. Admits that he eats what he wants. Takes Lipitor daily.    Review of Systems  Constitutional: Negative for malaise/fatigue.  Gastrointestinal: Positive for heartburn.  Musculoskeletal: Positive for arthritis and joint swelling.  Neurological: Negative for headaches.  All other systems reviewed and are negative.  Family History  Problem Relation Age of Onset  . Heart disease Father   . Hyperlipidemia Father   .  Hypertension Father   . Pancreatic cancer Father        started in pancreas, mets to stomach   . Stomach cancer Father   . Colon cancer Other 92  . Colon polyps Neg Hx   . Rectal cancer Neg Hx    Social History   Socioeconomic History  . Marital status: Married    Spouse name: Not on file  . Number of children: Not on file  . Years of education: Not on file  . Highest education level: Not on file  Occupational History  . Not on file  Tobacco Use  . Smoking status: Never Smoker  . Smokeless tobacco: Former Systems developer    Types: Snuff  Substance and Sexual Activity  . Alcohol use: Yes    Comment: occ 2 beers a day   . Drug use: No  . Sexual activity: Yes  Other Topics Concern  . Not on file  Social History Narrative  . Not on file   Social Determinants of Health   Financial Resource Strain:   . Difficulty of Paying Living Expenses: Not on file  Food Insecurity:   . Worried About Charity fundraiser in the Last Year: Not on file  . Ran Out of Food in the Last Year: Not on file  Transportation Needs:   . Lack of Transportation (Medical): Not on file  . Lack of Transportation (Non-Medical): Not on file  Physical Activity:   . Days of Exercise per Week: Not on file  . Minutes of Exercise per Session: Not on file  Stress:   . Feeling of Stress : Not on file  Social Connections:   . Frequency of Communication with  Friends and Family: Not on file  . Frequency of Social Gatherings with Friends and Family: Not on file  . Attends Religious Services: Not on file  . Active Member of Clubs or Organizations: Not on file  . Attends Archivist Meetings: Not on file  . Marital Status: Not on file       Objective:   Physical Exam Vitals reviewed.  Constitutional:      General: He is not in acute distress.    Appearance: He is well-developed.  HENT:     Head: Normocephalic.     Right Ear: Tympanic membrane normal.     Left Ear: Tympanic membrane normal.  Eyes:      General:        Right eye: No discharge.        Left eye: No discharge.     Pupils: Pupils are equal, round, and reactive to light.  Neck:     Thyroid: No thyromegaly.  Cardiovascular:     Rate and Rhythm: Normal rate and regular rhythm.     Heart sounds: Normal heart sounds. No murmur.  Pulmonary:     Effort: Pulmonary effort is normal. No respiratory distress.     Breath sounds: Normal breath sounds. No wheezing.  Abdominal:     General: Bowel sounds are normal. There is no distension.     Palpations: Abdomen is soft.     Tenderness: There is no abdominal tenderness.  Musculoskeletal:        General: No tenderness. Normal range of motion.     Cervical back: Normal range of motion and neck supple.  Skin:    General: Skin is warm and dry.     Findings: No erythema or rash.  Neurological:     Mental Status: He is alert and oriented to person, place, and time.     Cranial Nerves: No cranial nerve deficit.     Deep Tendon Reflexes: Reflexes are normal and symmetric.  Psychiatric:        Behavior: Behavior normal.        Thought Content: Thought content normal.        Judgment: Judgment normal.       BP 121/78   Pulse 67   Temp (!) 97.5 F (36.4 C) (Temporal)   Ht '5\' 10"'$  (1.778 m)   Wt 244 lb 3.2 oz (110.8 kg)   SpO2 96%   BMI 35.04 kg/m      Assessment & Plan:  Benjamin Edwards comes in today with chief complaint of Annual Exam   Diagnosis and orders addressed:  1. Annual physical exam - Cologuard - CMP14+EGFR - CBC with Differential/Platelet - Lipid panel - PSA, total and free - TSH  2. Essential hypertension - CMP14+EGFR - CBC with Differential/Platelet  3. Gastroesophageal reflux disease, unspecified whether esophagitis present - CMP14+EGFR - CBC with Differential/Platelet  4. Arthritis - CMP14+EGFR - CBC with Differential/Platelet  5. GAD (generalized anxiety disorder)  - CMP14+EGFR - CBC with Differential/Platelet  6. Hyperlipidemia,  unspecified hyperlipidemia type  - CMP14+EGFR - CBC with Differential/Platelet - Lipid panel  7. Obesity (BMI 30.0-34.9) - CMP14+EGFR - CBC with Differential/Platelet  8. Metabolic syndrome - JJH41+DEYC - CBC with Differential/Platelet  9. Colon cancer screening - Cologuard - CMP14+EGFR - CBC with Differential/Platelet  10. Screening for malignant neoplasm of the rectum - Cologuard - CMP14+EGFR - CBC with Differential/Platelet   Labs pending Health Maintenance reviewed Diet and exercise encouraged  Follow up plan: 1 year  Evelina Dun, [name]

## 2019-05-06 NOTE — Patient Instructions (Signed)

## 2019-05-07 LAB — CMP14+EGFR
ALT: 33 IU/L (ref 0–44)
AST: 28 IU/L (ref 0–40)
Albumin/Globulin Ratio: 1.7 (ref 1.2–2.2)
Albumin: 4.7 g/dL (ref 3.8–4.9)
Alkaline Phosphatase: 61 IU/L (ref 39–117)
BUN/Creatinine Ratio: 13 (ref 9–20)
BUN: 15 mg/dL (ref 6–24)
Bilirubin Total: 0.3 mg/dL (ref 0.0–1.2)
CO2: 26 mmol/L (ref 20–29)
Calcium: 9.6 mg/dL (ref 8.7–10.2)
Chloride: 102 mmol/L (ref 96–106)
Creatinine, Ser: 1.13 mg/dL (ref 0.76–1.27)
GFR calc Af Amer: 85 mL/min/{1.73_m2} (ref >59)
GFR calc non Af Amer: 73 mL/min/{1.73_m2} (ref >59)
Globulin, Total: 2.7 g/dL (ref 1.5–4.5)
Glucose: 97 mg/dL (ref 65–99)
Potassium: 3.6 mmol/L (ref 3.5–5.2)
Sodium: 143 mmol/L (ref 134–144)
Total Protein: 7.4 g/dL (ref 6.0–8.5)

## 2019-05-07 LAB — TSH: TSH: 0.787 u[IU]/mL (ref 0.450–4.500)

## 2019-05-07 LAB — CBC WITH DIFFERENTIAL/PLATELET
Basophils Absolute: 0 10*3/uL (ref 0.0–0.2)
Basos: 0 %
EOS (ABSOLUTE): 0.2 10*3/uL (ref 0.0–0.4)
Eos: 3 %
Hematocrit: 41.7 % (ref 37.5–51.0)
Hemoglobin: 14.1 g/dL (ref 13.0–17.7)
Immature Grans (Abs): 0 10*3/uL (ref 0.0–0.1)
Immature Granulocytes: 0 %
Lymphocytes Absolute: 2.1 10*3/uL (ref 0.7–3.1)
Lymphs: 36 %
MCH: 27.8 pg (ref 26.6–33.0)
MCHC: 33.8 g/dL (ref 31.5–35.7)
MCV: 82 fL (ref 79–97)
Monocytes Absolute: 0.4 10*3/uL (ref 0.1–0.9)
Monocytes: 8 %
Neutrophils Absolute: 3 10*3/uL (ref 1.4–7.0)
Neutrophils: 53 %
Platelets: 216 10*3/uL (ref 150–450)
RBC: 5.08 x10E6/uL (ref 4.14–5.80)
RDW: 13.8 % (ref 11.6–15.4)
WBC: 5.7 10*3/uL (ref 3.4–10.8)

## 2019-05-07 LAB — LIPID PANEL
Chol/HDL Ratio: 4.3 ratio (ref 0.0–5.0)
Cholesterol, Total: 162 mg/dL (ref 100–199)
HDL: 38 mg/dL — ABNORMAL LOW (ref >39)
LDL Chol Calc (NIH): 81 mg/dL (ref 0–99)
Triglycerides: 259 mg/dL — ABNORMAL HIGH (ref 0–149)
VLDL Cholesterol Cal: 43 mg/dL — ABNORMAL HIGH (ref 5–40)

## 2019-05-07 LAB — PSA, TOTAL AND FREE
PSA, Free Pct: 31.7 %
PSA, Free: 0.19 ng/mL
Prostate Specific Ag, Serum: 0.6 ng/mL (ref 0.0–4.0)

## 2019-07-07 ENCOUNTER — Other Ambulatory Visit: Payer: Self-pay | Admitting: Family

## 2019-07-07 DIAGNOSIS — Z23 Encounter for immunization: Secondary | ICD-10-CM | POA: Diagnosis not present

## 2019-07-07 MED ORDER — VALSARTAN 320 MG PO TABS: 320.0000 mg | ORAL_TABLET | Freq: Every day | ORAL | 3 refills | Status: DC

## 2019-07-07 MED ORDER — AMLODIPINE BESYLATE 10 MG PO TABS: 10.0000 mg | ORAL_TABLET | Freq: Every day | ORAL | 4 refills | Status: DC

## 2019-07-07 MED ORDER — HYDROCHLOROTHIAZIDE 25 MG PO TABS: 25.0000 mg | ORAL_TABLET | Freq: Every day | ORAL | 3 refills | Status: DC

## 2019-07-29 DIAGNOSIS — Z23 Encounter for immunization: Secondary | ICD-10-CM | POA: Diagnosis not present

## 2019-09-29 ENCOUNTER — Other Ambulatory Visit: Payer: Self-pay | Admitting: Family

## 2019-09-29 DIAGNOSIS — E785 Hyperlipidemia, unspecified: Secondary | ICD-10-CM

## 2019-09-29 DIAGNOSIS — I10 Essential (primary) hypertension: Secondary | ICD-10-CM

## 2019-09-30 ENCOUNTER — Other Ambulatory Visit: Payer: Self-pay | Admitting: Family

## 2019-09-30 DIAGNOSIS — E785 Hyperlipidemia, unspecified: Secondary | ICD-10-CM

## 2019-09-30 DIAGNOSIS — I10 Essential (primary) hypertension: Secondary | ICD-10-CM

## 2019-09-30 MED ORDER — METOPROLOL SUCCINATE ER 200 MG PO TB24: 200.0000 mg | ORAL_TABLET | Freq: Every day | ORAL | 1 refills | Status: DC

## 2019-09-30 MED ORDER — AMLODIPINE-VALSARTAN-HCTZ 10-320-25 MG PO TABS: 1.0000 | ORAL_TABLET | Freq: Every day | ORAL | 1 refills | Status: DC

## 2019-09-30 MED ORDER — ATORVASTATIN CALCIUM 20 MG PO TABS: 20.0000 mg | ORAL_TABLET | Freq: Every day | ORAL | 1 refills | Status: DC

## 2019-10-04 ENCOUNTER — Ambulatory Visit (INDEPENDENT_AMBULATORY_CARE_PROVIDER_SITE_OTHER): Payer: BC Managed Care – PPO | Admitting: Family

## 2019-10-04 ENCOUNTER — Encounter: Payer: Self-pay | Admitting: Family

## 2019-10-04 ENCOUNTER — Other Ambulatory Visit: Payer: Self-pay

## 2019-10-04 VITALS — BP 119/79 | HR 65 | Temp 98.7°F | Ht 70.0 in | Wt 239.8 lb

## 2019-10-04 DIAGNOSIS — M109 Gout, unspecified: Secondary | ICD-10-CM

## 2019-10-04 DIAGNOSIS — I10 Essential (primary) hypertension: Secondary | ICD-10-CM | POA: Diagnosis not present

## 2019-10-04 MED ORDER — AMLODIPINE BESYLATE-VALSARTAN 10-320 MG PO TABS: 1.0000 | ORAL_TABLET | Freq: Every day | ORAL | 2 refills | Status: DC

## 2019-10-04 MED ORDER — COLCHICINE 0.6 MG PO TABS: ORAL_TABLET | ORAL | 2 refills | Status: DC

## 2019-10-04 MED ORDER — HYDROCHLOROTHIAZIDE 12.5 MG PO TABS: 12.5000 mg | ORAL_TABLET | Freq: Every day | ORAL | 3 refills | Status: DC

## 2019-10-04 NOTE — Progress Notes (Signed)
Subjective:    Patient ID: Benjamin Edwards, male    DOB: 01/14/1966, 54 y.o.   MRN: [id number]  Chief Complaint  Patient presents with   Gout    left big toe started over the weekend    Pt presents to the office with left great toe pain that started over the weekend. He reports the pain is a 10 out 10 that is worse at night. He has a history of gout.   He does take HCTZ 25 mg. Hypertension This is a chronic problem. The current episode started more than 1 year ago. The problem has been resolved since onset. The problem is controlled. Pertinent negatives include no peripheral edema or shortness of breath. Past treatments include calcium channel blockers, beta blockers and diuretics. The current treatment provides moderate improvement. There is no history of kidney disease or heart failure.       Review of Systems  Respiratory: Negative for shortness of breath.   All other systems reviewed and are negative.      Objective:   Physical Exam Vitals reviewed.  Constitutional:      General: He is not in acute distress.    Appearance: He is well-developed. He is obese.  HENT:     Head: Normocephalic.  Eyes:     General:        Right eye: No discharge.        Left eye: No discharge.     Pupils: Pupils are equal, round, and reactive to light.  Neck:     Thyroid: No thyromegaly.  Cardiovascular:     Rate and Rhythm: Normal rate and regular rhythm.     Heart sounds: Normal heart sounds. No murmur heard.   Pulmonary:     Effort: Pulmonary effort is normal. No respiratory distress.     Breath sounds: Normal breath sounds. No wheezing.  Abdominal:     General: Bowel sounds are normal. There is no distension.     Palpations: Abdomen is soft.     Tenderness: There is no abdominal tenderness.  Musculoskeletal:        General: Tenderness (left great toe pain) present.     Cervical back: Normal range of motion and neck supple.  Skin:    General: Skin is warm and dry.      Findings: No erythema or rash.  Neurological:     Mental Status: He is alert and oriented to person, place, and time.     Cranial Nerves: No cranial nerve deficit.     Deep Tendon Reflexes: Reflexes are normal and symmetric.  Psychiatric:        Behavior: Behavior normal.        Thought Content: Thought content normal.        Judgment: Judgment normal.       BP 119/79    Pulse 65    Temp 98.7 F (37.1 C) (Temporal)    Ht '5\' 10"'$  (1.778 m)    Wt 239 lb 12.8 oz (108.8 kg)    BMI 34.41 kg/m      Assessment & Plan:  Benjamin Edwards comes in today with chief complaint of Gout (left big toe started over the weekend )   Diagnosis and orders addressed:  1. Essential hypertension Will stop HCTZ 25 mg, if BP is greater than 140/90 start HCTZ 12.5 mg  - amLODipine-valsartan (EXFORGE) 10-320 MG tablet; Take 1 tablet by mouth daily.  Dispense: 90 tablet; Refill: 2 - hydrochlorothiazide (HYDRODIURIL) 12.5 MG  tablet; Take 1 tablet (12.5 mg total) by mouth daily.  Dispense: 90 tablet; Refill: 3 - BMP8+EGFR  2. Acute gout involving toe of left foot, unspecified cause Low purine diet Force fluids NSAID's Labs pending Colchicine as needed - BMP8+EGFR - Uric acid - colchicine 0.6 MG tablet; Take 1.2 mg then 0.6 mg, max 1.8 mg/day  Dispense: 20 tablet; Refill: 2  Keep follow up visit  Evelina Dun, [name]

## 2019-10-04 NOTE — Patient Instructions (Signed)
Gout  Gout is a condition that causes painful swelling of the joints. Gout is a type of inflammation of the joints (arthritis). This condition is caused by having too much uric acid in the body. Uric acid is a chemical that forms when the body breaks down substances called purines. Purines are important for building body proteins. When the body has too much uric acid, sharp crystals can form and build up inside the joints. This causes pain and swelling. Gout attacks can happen quickly and may be very painful (acute gout). Over time, the attacks can affect more joints and become more frequent (chronic gout). Gout can also cause uric acid to build up under the skin and inside the kidneys. What are the causes? This condition is caused by too much uric acid in your blood. This can happen because:  Your kidneys do not remove enough uric acid from your blood. This is the most common cause.  Your body makes too much uric acid. This can happen with some cancers and cancer treatments. It can also occur if your body is breaking down too many red blood cells (hemolytic anemia).  You eat too many foods that are high in purines. These foods include organ meats and some seafood. Alcohol, especially beer, is also high in purines. A gout attack may be triggered by trauma or stress. What increases the risk? You are more likely to develop this condition if you:  Have a family history of gout.  Are male and middle-aged.  Are male and have gone through menopause.  Are obese.  Frequently drink alcohol, especially beer.  Are dehydrated.  Lose weight too quickly.  Have an organ transplant.  Have lead poisoning.  Take certain medicines, including aspirin, cyclosporine, diuretics, levodopa, and niacin.  Have kidney disease.  Have a skin condition called psoriasis. What are the signs or symptoms? An attack of acute gout happens quickly. It usually occurs in just one joint. The most common place is  the big toe. Attacks often start at night. Other joints that may be affected include joints of the feet, ankle, knee, fingers, wrist, or elbow. Symptoms of this condition may include:  Severe pain.  Warmth.  Swelling.  Stiffness.  Tenderness. The affected joint may be very painful to touch.  Shiny, red, or purple skin.  Chills and fever. Chronic gout may cause symptoms more frequently. More joints may be involved. You may also have white or yellow lumps (tophi) on your hands or feet or in other areas near your joints. How is this diagnosed? This condition is diagnosed based on your symptoms, medical history, and physical exam. You may have tests, such as:  Blood tests to measure uric acid levels.  Removal of joint fluid with a thin needle (aspiration) to look for uric acid crystals.  X-rays to look for joint damage. How is this treated? Treatment for this condition has two phases: treating an acute attack and preventing future attacks. Acute gout treatment may include medicines to reduce pain and swelling, including:  NSAIDs.  Steroids. These are strong anti-inflammatory medicines that can be taken by mouth (orally) or injected into a joint.  Colchicine. This medicine relieves pain and swelling when it is taken soon after an attack. It can be given by mouth or through an IV. Preventive treatment may include:  Daily use of smaller doses of NSAIDs or colchicine.  Use of a medicine that reduces uric acid levels in your blood.  Changes to your diet. You may   need to see a dietitian about what to eat and drink to prevent gout. Follow these instructions at home: During a gout attack   If directed, put ice on the affected area: ? Put ice in a plastic bag. ? Place a towel between your skin and the bag. ? Leave the ice on for 20 minutes, 2-3 times a day.  Raise (elevate) the affected joint above the level of your heart as often as possible.  Rest the joint as much as possible.  If the affected joint is in your leg, you may be given crutches to use.  Follow instructions from your health care provider about eating or drinking restrictions. Avoiding future gout attacks  Follow a low-purine diet as told by your dietitian or health care provider. Avoid foods and drinks that are high in purines, including liver, kidney, anchovies, asparagus, herring, mushrooms, mussels, and beer.  Maintain a healthy weight or lose weight if you are overweight. If you want to lose weight, talk with your health care provider. It is important that you do not lose weight too quickly.  Start or maintain an exercise program as told by your health care provider. Eating and drinking  Drink enough fluids to keep your urine pale yellow.  If you drink alcohol: ? Limit how much you use to:  0-1 drink a day for women.  0-2 drinks a day for men. ? Be aware of how much alcohol is in your drink. In the [place] one drink equals one 12 oz bottle of beer (355 mL) one 5 oz glass of wine (148 mL), or one 1 oz glass of hard liquor (44 mL). General instructions  Take over-the-counter and prescription medicines only as told by your health care provider.  Do not drive or use heavy machinery while taking prescription pain medicine.  Return to your normal activities as told by your health care provider. Ask your health care provider what activities are safe for you.  Keep all follow-up visits as told by your health care provider. This is important. Contact a health care provider if you have:  Another gout attack.  Continuing symptoms of a gout attack after 10 days of treatment.  Side effects from your medicines.  Chills or a fever.  Burning pain when you urinate.  Pain in your lower back or belly. Get help right away if you:  Have severe or uncontrolled pain.  Cannot urinate. Summary  Gout is painful swelling of the joints caused by inflammation.  The most common site of pain is the big  toe, but it can affect other joints in the body.  Medicines and dietary changes can help to prevent and treat gout attacks. This information is not intended to replace advice given to you by your health care provider. Make sure you discuss any questions you have with your health care provider. Document Revised: 10/14/2017 Document Reviewed: 10/14/2017 [employer] Patient Education  2020 [employer]  

## 2019-10-05 LAB — BMP8+EGFR
BUN/Creatinine Ratio: 12 (ref 9–20)
BUN: 13 mg/dL (ref 6–24)
CO2: 26 mmol/L (ref 20–29)
Calcium: 9.6 mg/dL (ref 8.7–10.2)
Chloride: 102 mmol/L (ref 96–106)
Creatinine, Ser: 1.07 mg/dL (ref 0.76–1.27)
GFR calc Af Amer: 90 mL/min/{1.73_m2} (ref >59)
GFR calc non Af Amer: 78 mL/min/{1.73_m2} (ref >59)
Glucose: 95 mg/dL (ref 65–99)
Potassium: 3.7 mmol/L (ref 3.5–5.2)
Sodium: 144 mmol/L (ref 134–144)

## 2019-10-05 LAB — URIC ACID: Uric Acid: 9.1 mg/dL — ABNORMAL HIGH (ref 3.8–8.4)

## 2019-11-03 ENCOUNTER — Other Ambulatory Visit: Payer: Self-pay

## 2019-11-03 ENCOUNTER — Ambulatory Visit: Payer: BC Managed Care – PPO | Admitting: Family

## 2019-11-03 ENCOUNTER — Encounter: Payer: Self-pay | Admitting: Family

## 2019-11-03 VITALS — BP 118/77 | HR 65 | Temp 97.6°F | Ht 70.0 in | Wt 238.4 lb

## 2019-11-03 DIAGNOSIS — E559 Vitamin D deficiency, unspecified: Secondary | ICD-10-CM | POA: Diagnosis not present

## 2019-11-03 DIAGNOSIS — E8881 Metabolic syndrome: Secondary | ICD-10-CM | POA: Diagnosis not present

## 2019-11-03 DIAGNOSIS — M1990 Unspecified osteoarthritis, unspecified site: Secondary | ICD-10-CM

## 2019-11-03 DIAGNOSIS — E669 Obesity, unspecified: Secondary | ICD-10-CM

## 2019-11-03 DIAGNOSIS — E785 Hyperlipidemia, unspecified: Secondary | ICD-10-CM

## 2019-11-03 DIAGNOSIS — K219 Gastro-esophageal reflux disease without esophagitis: Secondary | ICD-10-CM | POA: Diagnosis not present

## 2019-11-03 DIAGNOSIS — F411 Generalized anxiety disorder: Secondary | ICD-10-CM

## 2019-11-03 DIAGNOSIS — I10 Essential (primary) hypertension: Secondary | ICD-10-CM

## 2019-11-03 DIAGNOSIS — Z1159 Encounter for screening for other viral diseases: Secondary | ICD-10-CM

## 2019-11-03 DIAGNOSIS — Z1211 Encounter for screening for malignant neoplasm of colon: Secondary | ICD-10-CM

## 2019-11-03 MED ORDER — ATORVASTATIN CALCIUM 20 MG PO TABS: 20.0000 mg | ORAL_TABLET | Freq: Every day | ORAL | 1 refills | Status: DC

## 2019-11-03 MED ORDER — AMLODIPINE BESYLATE-VALSARTAN 10-320 MG PO TABS: 1.0000 | ORAL_TABLET | Freq: Every day | ORAL | 2 refills | Status: DC

## 2019-11-03 MED ORDER — METOPROLOL SUCCINATE ER 200 MG PO TB24: 200.0000 mg | ORAL_TABLET | Freq: Every day | ORAL | 1 refills | Status: DC

## 2019-11-03 NOTE — Progress Notes (Signed)
Subjective:    Patient ID: Benjamin Edwards, male    DOB: 1966/01/15, 54 y.o.   MRN: [id number]  Chief Complaint  Patient presents with  . Medical Management of Chronic Issues    NOT FASTING    Pt presents to the office today for chronic follow up.  Hypertension This is a chronic problem. The current episode started more than 1 year ago. The problem has been resolved since onset. The problem is controlled. Associated symptoms include anxiety. Pertinent negatives include no malaise/fatigue, peripheral edema or shortness of breath. Risk factors for coronary artery disease include dyslipidemia, obesity and sedentary lifestyle. The current treatment provides moderate improvement. There is no history of CVA or heart failure.  Gastroesophageal Reflux He complains of belching and heartburn. This is a chronic problem. The current episode started more than 1 year ago. The problem occurs rarely. The symptoms are aggravated by certain foods. He has tried an antacid for the symptoms. The treatment provided moderate relief.  Arthritis Presents for follow-up visit. He complains of pain and stiffness. The symptoms have been stable. Affected locations include the right knee, left knee, right ankle and left ankle. His pain is at a severity of 1/10.  Hyperlipidemia This is a chronic problem. The current episode started more than 1 year ago. The problem is uncontrolled. Recent lipid tests were reviewed and are high. Exacerbating diseases include obesity. Pertinent negatives include no shortness of breath. Current antihyperlipidemic treatment includes statins. The current treatment provides moderate improvement of lipids. Risk factors for coronary artery disease include dyslipidemia, male sex, hypertension and a sedentary lifestyle.  Anxiety Presents for follow-up visit. Symptoms include depressed mood, irritability and nervous/anxious behavior. Patient reports no shortness of breath. Symptoms occur occasionally. The  severity of symptoms is moderate.    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 He does eat mostly at home. Takes Lipitor daily.    Review of Systems  Constitutional: Positive for irritability. Negative for malaise/fatigue.  Respiratory: Negative for shortness of breath.   Gastrointestinal: Positive for heartburn.  Musculoskeletal: Positive for arthritis and stiffness.  Psychiatric/Behavioral: The patient is nervous/anxious.   All other systems reviewed and are negative.      Objective:   Physical Exam Vitals reviewed.  Constitutional:      General: He is not in acute distress.    Appearance: He is well-developed.  HENT:     Head: Normocephalic.     Right Ear: Tympanic membrane normal.     Left Ear: Tympanic membrane normal.  Eyes:     General:        Right eye: No discharge.        Left eye: No discharge.     Pupils: Pupils are equal, round, and reactive to light.  Neck:     Thyroid: No thyromegaly.  Cardiovascular:     Rate and Rhythm: Normal rate and regular rhythm.     Heart sounds: Normal heart sounds. No murmur heard.   Pulmonary:     Effort: Pulmonary effort is normal. No respiratory distress.     Breath sounds: Normal breath sounds. No wheezing.  Abdominal:     General: Bowel sounds are normal. There is no distension.     Palpations: Abdomen is soft.     Tenderness: There is no abdominal tenderness.  Musculoskeletal:        General: No tenderness. Normal range of motion.     Cervical back: Normal  range of motion and neck supple.  Skin:    General: Skin is warm and dry.     Findings: No erythema or rash.  Neurological:     Mental Status: He is alert and oriented to person, place, and time.     Cranial Nerves: No cranial nerve deficit.     Deep Tendon Reflexes: Reflexes are normal and symmetric.  Psychiatric:        Behavior: Behavior normal.        Thought Content: Thought content normal.         Judgment: Judgment normal.       BP 118/77   Pulse 65   Temp 97.6 F (36.4 C) (Temporal)   Ht _0  (1.778 m)   Wt (!) 238 lb 6.4 oz (108.1 kg)   SpO2 96%   BMI 34.21 kg/m      Assessment & Plan:  Benjamin Edwards comes in today with chief complaint of Medical Management of Chronic Issues (NOT FASTING )   Diagnosis and orders addressed:  1. Essential hypertension - metoprolol (TOPROL-XL) 200 MG 24 hr tablet; Take 1 tablet (200 mg total) by mouth daily. (Needs to be seen before next refill)  Dispense: 90 tablet; Refill: 1 - amLODipine-valsartan (EXFORGE) 10-320 MG tablet; Take 1 tablet by mouth daily.  Dispense: 90 tablet; Refill: 2 - CMP14+EGFR - CBC with Differential/Platelet  2. Hyperlipidemia, unspecified hyperlipidemia type - atorvastatin (LIPITOR) 20 MG tablet; Take 1 tablet (20 mg total) by mouth daily. (Needs to be seen before next refill)  Dispense: 90 tablet; Refill: 1 - CMP14+EGFR - CBC with Differential/Platelet  3. Gastroesophageal reflux disease, unspecified whether esophagitis present - CMP14+EGFR - CBC with Differential/Platelet  4. Arthritis - CMP14+EGFR - CBC with Differential/Platelet  5. GAD (generalized anxiety disorder) - CMP14+EGFR - CBC with Differential/Platelet  6. Metabolic syndrome - KPQ24+SLPN - CBC with Differential/Platelet  7. Obesity (BMI 30.0-34.9) - CMP14+EGFR - CBC with Differential/Platelet  8. Need for hepatitis C screening test - CMP14+EGFR - CBC with Differential/Platelet - Hepatitis C antibody  9. Vitamin D deficiency - CMP14+EGFR - CBC with Differential/Platelet - VITAMIN D 25 Hydroxy (Vit-D Deficiency, Fractures)  10. Colon cancer screening - Ambulatory referral to Gastroenterology   Labs pending Health Maintenance reviewed Diet and exercise encouraged  Follow up plan: 6 months   Benjamin Dun, [name]

## 2019-11-03 NOTE — Patient Instructions (Signed)

## 2019-11-04 LAB — CBC WITH DIFFERENTIAL/PLATELET
Basophils Absolute: 0 10*3/uL (ref 0.0–0.2)
Basos: 0 %
EOS (ABSOLUTE): 0.2 10*3/uL (ref 0.0–0.4)
Eos: 3 %
Hematocrit: 40.9 % (ref 37.5–51.0)
Hemoglobin: 14.2 g/dL (ref 13.0–17.7)
Immature Grans (Abs): 0 10*3/uL (ref 0.0–0.1)
Immature Granulocytes: 0 %
Lymphocytes Absolute: 1.5 10*3/uL (ref 0.7–3.1)
Lymphs: 28 %
MCH: 28.5 pg (ref 26.6–33.0)
MCHC: 34.7 g/dL (ref 31.5–35.7)
MCV: 82 fL (ref 79–97)
Monocytes Absolute: 0.5 10*3/uL (ref 0.1–0.9)
Monocytes: 10 %
Neutrophils Absolute: 3 10*3/uL (ref 1.4–7.0)
Neutrophils: 59 %
Platelets: 207 10*3/uL (ref 150–450)
RBC: 4.99 x10E6/uL (ref 4.14–5.80)
RDW: 13.4 % (ref 11.6–15.4)
WBC: 5.2 10*3/uL (ref 3.4–10.8)

## 2019-11-04 LAB — CMP14+EGFR
ALT: 22 IU/L (ref 0–44)
AST: 22 IU/L (ref 0–40)
Albumin/Globulin Ratio: 2 (ref 1.2–2.2)
Albumin: 4.4 g/dL (ref 3.8–4.9)
Alkaline Phosphatase: 63 IU/L (ref 48–121)
BUN/Creatinine Ratio: 11 (ref 9–20)
BUN: 12 mg/dL (ref 6–24)
Bilirubin Total: 0.6 mg/dL (ref 0.0–1.2)
CO2: 23 mmol/L (ref 20–29)
Calcium: 9.3 mg/dL (ref 8.7–10.2)
Chloride: 105 mmol/L (ref 96–106)
Creatinine, Ser: 1.1 mg/dL (ref 0.76–1.27)
GFR calc Af Amer: 87 mL/min/{1.73_m2} (ref >59)
GFR calc non Af Amer: 75 mL/min/{1.73_m2} (ref >59)
Globulin, Total: 2.2 g/dL (ref 1.5–4.5)
Glucose: 99 mg/dL (ref 65–99)
Potassium: 3.9 mmol/L (ref 3.5–5.2)
Sodium: 143 mmol/L (ref 134–144)
Total Protein: 6.6 g/dL (ref 6.0–8.5)

## 2019-11-04 LAB — VITAMIN D 25 HYDROXY (VIT D DEFICIENCY, FRACTURES): Vit D, 25-Hydroxy: 33.9 ng/mL (ref 30.0–100.0)

## 2019-11-04 LAB — HEPATITIS C ANTIBODY: Hep C Virus Ab: <0.1 s/co ratio (ref 0.0–0.9)

## 2020-03-29 IMAGING — MR MRI OF THE RIGHT KNEE WITHOUT CONTRAST
4 of 7 series · 14 of 40 positions shown · non-contrast
Comparison: Radiographs dated 09/20/2018

CLINICAL DATA: Medial right knee pain for 6 months.

EXAM:
MRI OF THE RIGHT KNEE WITHOUT CONTRAST
TECHNIQUE: Multiplanar, multisequence MR imaging of the knee was performed. No
intravenous contrast was administered.

[Series 9: T2 fat-sat · axial · 4.0mm · 0.24mm/px · z∈[-51,+59]mm · 3 of 28 slices shown]
[im 6/28]
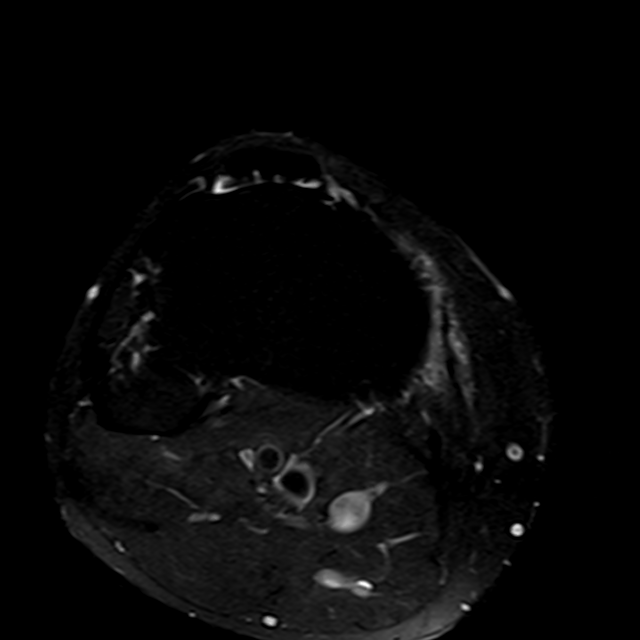
[im 17/28]
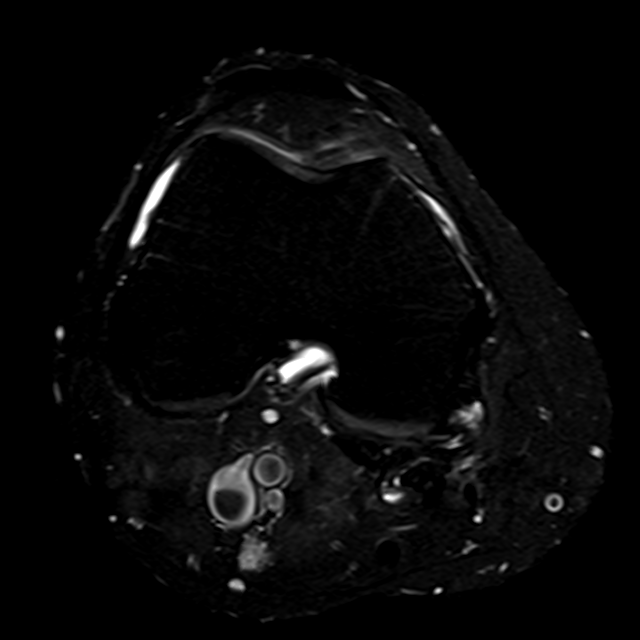
[im 28/28]
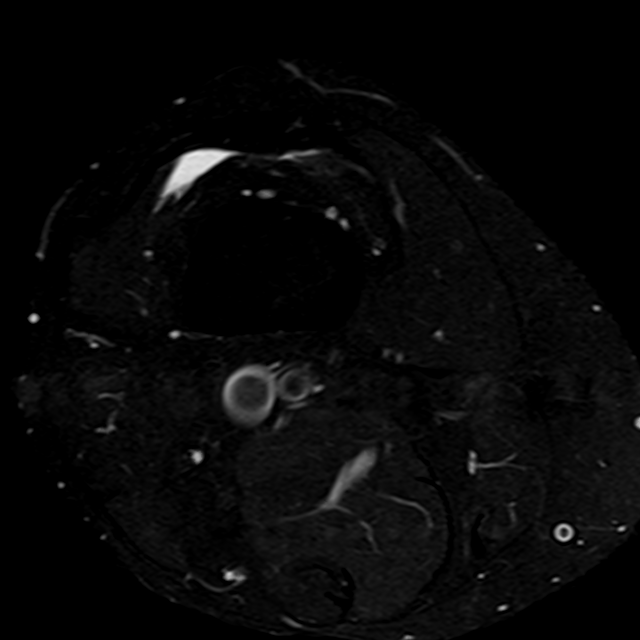

[Series 12: PD fat-sat · coronal · 3.0mm · 0.23mm/px · 5 of 36 slices shown (1 of 3)]
[im 1/36]
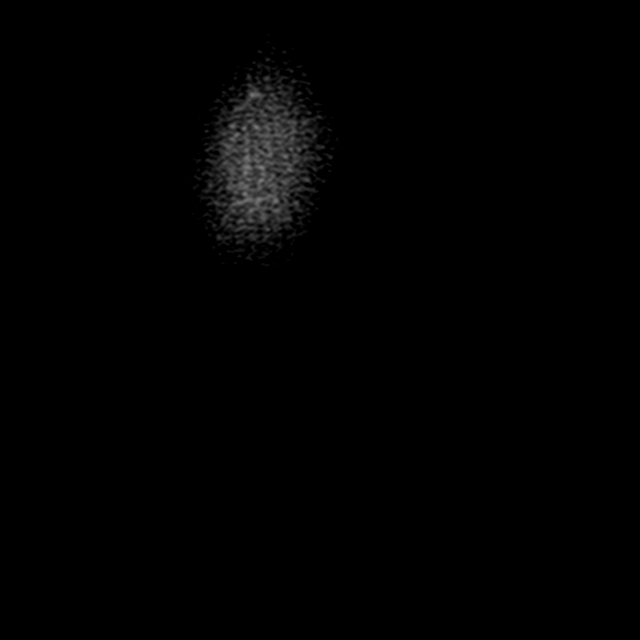
[im 6/36]
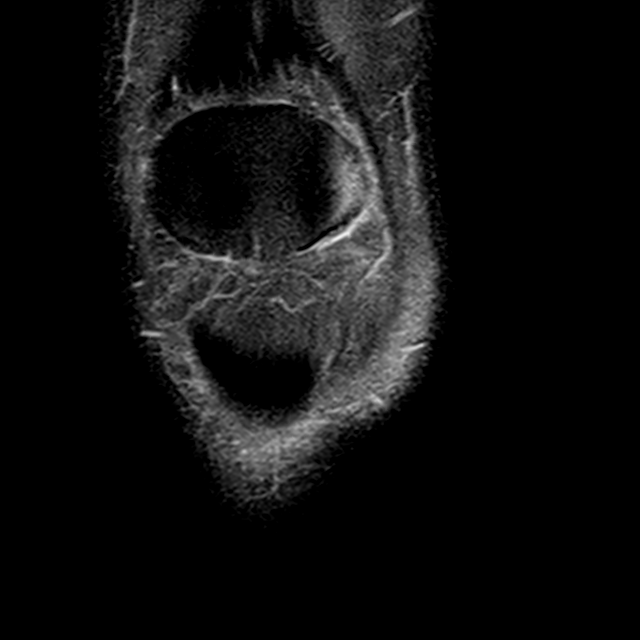
[im 12/36]
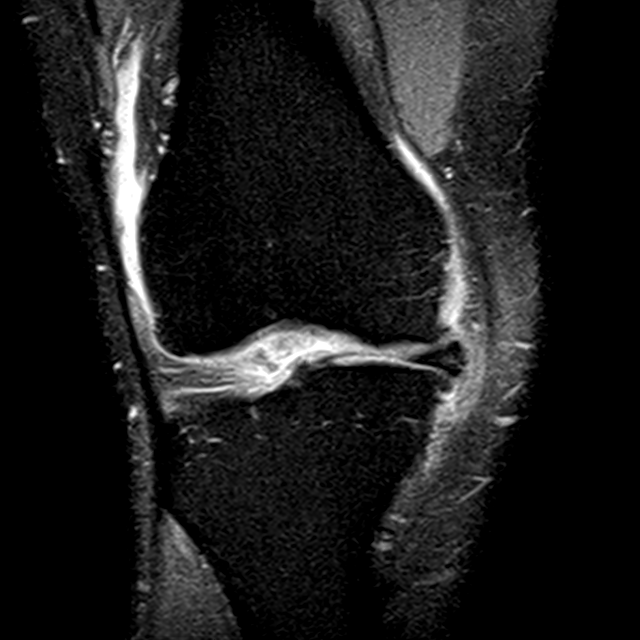
[im 18/36]
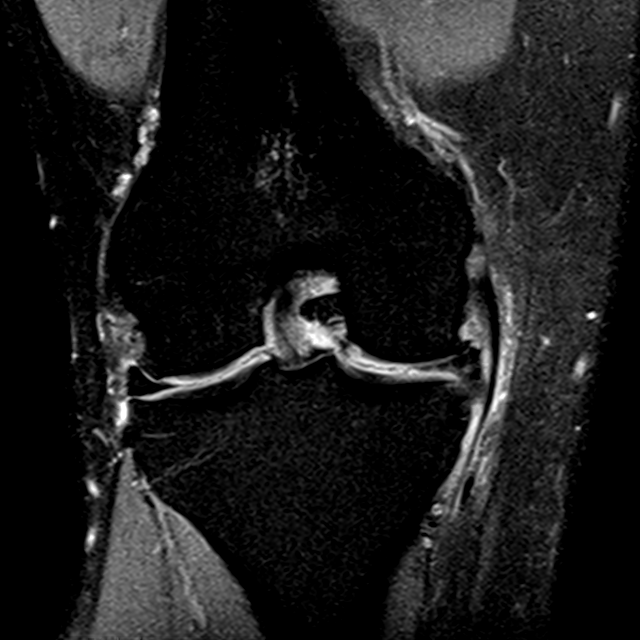
[im 30/36]
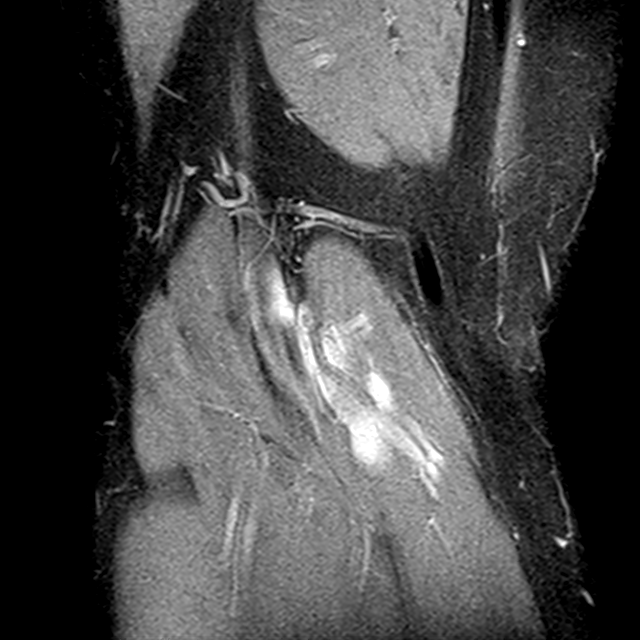

[Series 13: PD fat-sat · sagittal · 3.0mm · 0.24mm/px · 3 of 29 slices shown (2 of 3)]
[im 6/29]
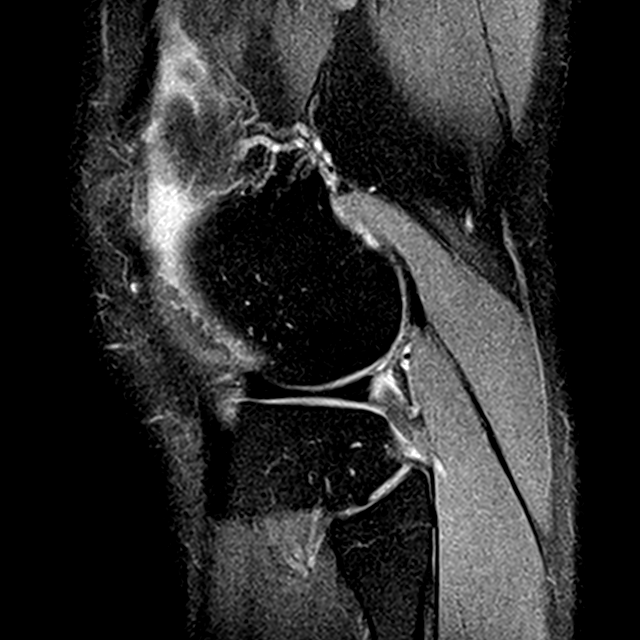
[im 17/29]
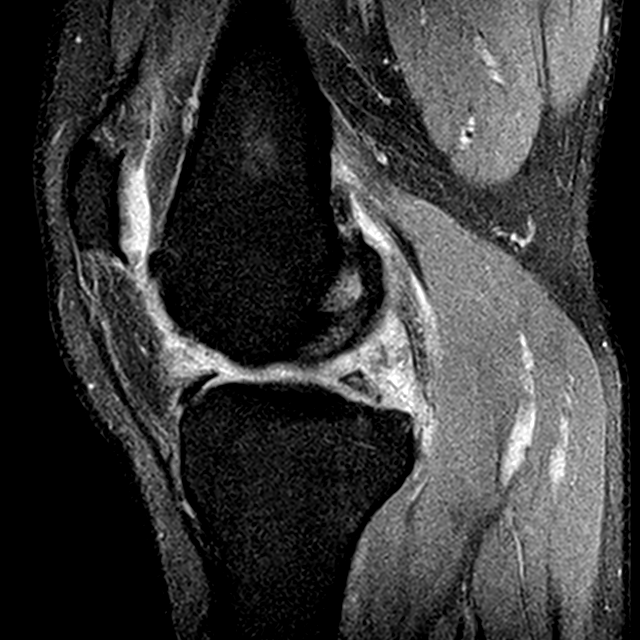
[im 29/29]
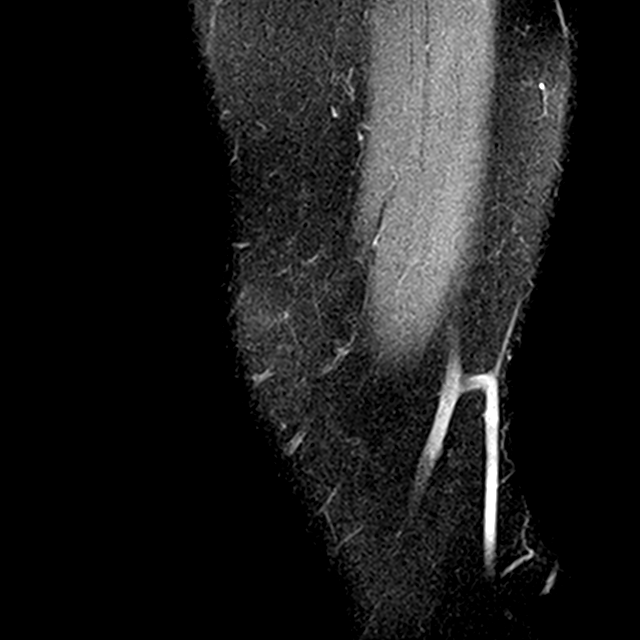

[Series 15: PD fat-sat · coronal · 2.0mm · 0.26mm/px · 3 of 15 slices shown (3 of 3)]
[im 1/15]
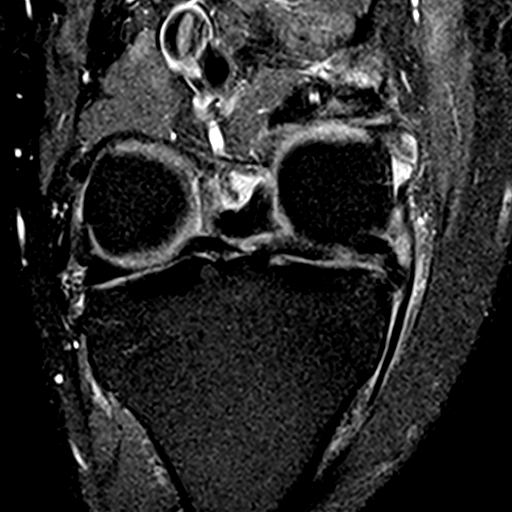
[im 8/15]
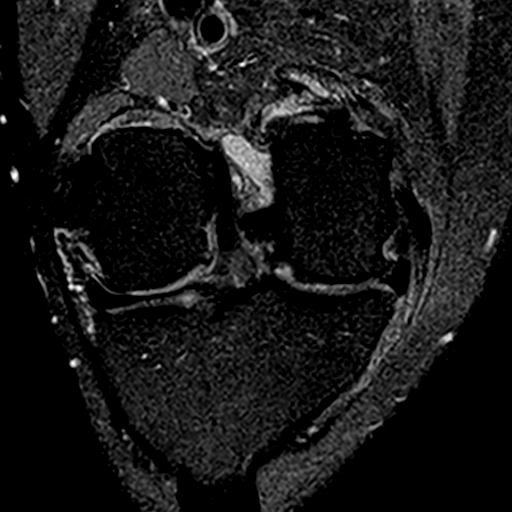
[im 15/15]
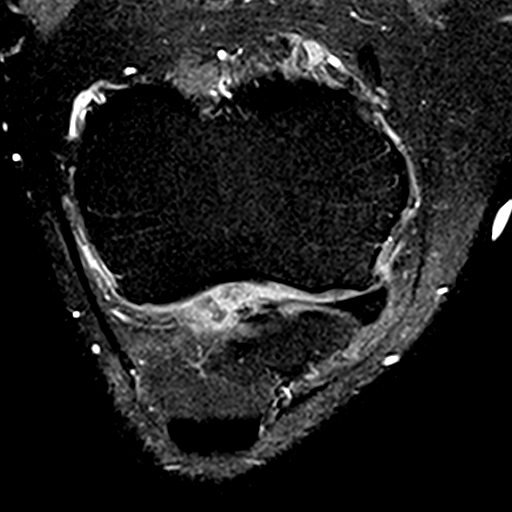

[14 of 40 positions shown; findings below may reference images not displayed]

FINDINGS: MENISCI

Medial meniscus: There is a complex tear of the posterior horn with
peripheral and longitudinal and oblique tears. The tear extends to
the periphery of the posterior horn with multiple tiny parameniscal
cysts best seen on image 13 of series 11. There is a torn fragment
of meniscus extending into the inferior gutter at the posteromedial
corner best seen on image 18 of series 12.

Lateral meniscus:  Normal.

LIGAMENTS

Cruciates:  Normal.

Collaterals: Intact. Slight edema superficial and deep to the MCL is
felt to be due to the underlying medial compartment disease.

CARTILAGE

Patellofemoral: Focal partial-thickness cartilage loss of the apex
of the patella. Thinning and cartilage irregularity in the central
portion of the trochlear groove of the distal femur.

Medial: Irregular diffuse thinning of the articular cartilage of the
medial compartment.

Lateral:  Normal.

Joint: Small joint effusion. Normal Hoffa's fat pad. No plical
thickening.

Popliteal Fossa:  Small Baker's cyst.  Intact popliteus tendon.

Extensor Mechanism:  Normal.

Bones: Small marginal osteophytes in the medial and patellofemoral
compartments.

Other: None
IMPRESSION: 1. Complex tear of the posterior horn of the medial meniscus.
2. Cartilage loss in the patellofemoral and medial compartments.
3. Small joint effusion with small Baker's cyst.

## 2020-05-04 ENCOUNTER — Other Ambulatory Visit: Payer: Self-pay

## 2020-05-04 ENCOUNTER — Encounter: Payer: Self-pay | Admitting: Family

## 2020-05-04 ENCOUNTER — Ambulatory Visit: Payer: BC Managed Care – PPO | Admitting: Family

## 2020-05-04 VITALS — BP 119/80 | HR 62 | Temp 97.1°F | Ht 70.0 in | Wt 245.0 lb

## 2020-05-04 DIAGNOSIS — Z23 Encounter for immunization: Secondary | ICD-10-CM | POA: Diagnosis not present

## 2020-05-04 DIAGNOSIS — Z1211 Encounter for screening for malignant neoplasm of colon: Secondary | ICD-10-CM

## 2020-05-04 DIAGNOSIS — I10 Essential (primary) hypertension: Secondary | ICD-10-CM | POA: Diagnosis not present

## 2020-05-04 DIAGNOSIS — K219 Gastro-esophageal reflux disease without esophagitis: Secondary | ICD-10-CM

## 2020-05-04 DIAGNOSIS — M1990 Unspecified osteoarthritis, unspecified site: Secondary | ICD-10-CM

## 2020-05-04 DIAGNOSIS — Z0001 Encounter for general adult medical examination with abnormal findings: Secondary | ICD-10-CM | POA: Diagnosis not present

## 2020-05-04 DIAGNOSIS — E785 Hyperlipidemia, unspecified: Secondary | ICD-10-CM | POA: Diagnosis not present

## 2020-05-04 DIAGNOSIS — Z0000 Encounter for general adult medical examination without abnormal findings: Secondary | ICD-10-CM

## 2020-05-04 DIAGNOSIS — E669 Obesity, unspecified: Secondary | ICD-10-CM | POA: Diagnosis not present

## 2020-05-04 MED ORDER — AMLODIPINE BESYLATE-VALSARTAN 10-320 MG PO TABS: 1.0000 | ORAL_TABLET | Freq: Every day | ORAL | 2 refills | Status: DC

## 2020-05-04 MED ORDER — ATORVASTATIN CALCIUM 20 MG PO TABS: 20.0000 mg | ORAL_TABLET | Freq: Every day | ORAL | 1 refills | Status: DC

## 2020-05-04 MED ORDER — METOPROLOL SUCCINATE ER 200 MG PO TB24: 200.0000 mg | ORAL_TABLET | Freq: Every day | ORAL | 1 refills | Status: DC

## 2020-05-04 NOTE — Progress Notes (Signed)
Subjective:    Patient ID: Benjamin Edwards, male    DOB: 1965-08-02, 55 y.o.   MRN: [id number]  Chief Complaint  Patient presents with  . Medical Management of Chronic Issues  . Hypertension   Pt presents to the office today for CPE. Hypertension This is a chronic problem. The current episode started more than 1 year ago. The problem has been resolved since onset. The problem is controlled. Associated symptoms include anxiety. Pertinent negatives include no malaise/fatigue, peripheral edema or shortness of breath. Risk factors for coronary artery disease include dyslipidemia, obesity, male gender and sedentary lifestyle. The current treatment provides moderate improvement.  Gastroesophageal Reflux He complains of belching and heartburn. This is a chronic problem. The current episode started more than 1 year ago. The problem occurs occasionally. The problem has been waxing and waning. Risk factors include obesity. He has tried a PPI for the symptoms. The treatment provided moderate relief.  Arthritis Presents for follow-up visit. He complains of pain. The symptoms have been stable. Affected locations include the left knee and right knee. His pain is at a severity of 5/10.  Hyperlipidemia This is a chronic problem. The current episode started more than 1 year ago. The problem is uncontrolled. Pertinent negatives include no shortness of breath. Current antihyperlipidemic treatment includes statins. The current treatment provides moderate improvement of lipids. Risk factors for coronary artery disease include dyslipidemia, male sex, hypertension and a sedentary lifestyle.  Anxiety Presents for follow-up visit. Symptoms include depressed mood, excessive worry, irritability and restlessness. Patient reports no shortness of breath. Symptoms occur occasionally. The severity of symptoms is moderate. The quality of sleep is good.         Review of Systems  Constitutional: Positive for irritability.  Negative for malaise/fatigue.  Respiratory: Negative for shortness of breath.   Gastrointestinal: Positive for heartburn.  Musculoskeletal: Positive for arthritis.  All other systems reviewed and are negative.      Objective:   Physical Exam Vitals reviewed.  Constitutional:      General: He is not in acute distress.    Appearance: He is well-developed and well-nourished.  HENT:     Head: Normocephalic.     Right Ear: Tympanic membrane normal.     Left Ear: Tympanic membrane normal.     Mouth/Throat:     Mouth: Oropharynx is clear and moist.  Eyes:     General:        Right eye: No discharge.        Left eye: No discharge.     Pupils: Pupils are equal, round, and reactive to light.  Neck:     Thyroid: No thyromegaly.  Cardiovascular:     Rate and Rhythm: Normal rate and regular rhythm.     Pulses: Intact distal pulses.     Heart sounds: Normal heart sounds. No murmur heard.   Pulmonary:     Effort: Pulmonary effort is normal. No respiratory distress.     Breath sounds: Normal breath sounds. No wheezing.  Abdominal:     General: Bowel sounds are normal. There is no distension.     Palpations: Abdomen is soft.     Tenderness: There is no abdominal tenderness.  Musculoskeletal:        General: No tenderness or edema. Normal range of motion.     Cervical back: Normal range of motion and neck supple.  Skin:    General: Skin is warm and dry.     Findings: No erythema or rash.  Neurological:     Mental Status: He is alert and oriented to person, place, and time.     Cranial Nerves: No cranial nerve deficit.     Deep Tendon Reflexes: Reflexes are normal and symmetric.  Psychiatric:        Mood and Affect: Mood and affect normal.        Behavior: Behavior normal.        Thought Content: Thought content normal.        Judgment: Judgment normal.       BP 119/80   Pulse 62   Temp (!) 97.1 F (36.2 C) (Temporal)   Ht 5' 10" (1.778 m)   Wt 245 lb (111.1 kg)   BMI  35.15 kg/m      Assessment & Plan:  Londell Noll comes in today with chief complaint of Medical Management of Chronic Issues and Hypertension   Diagnosis and orders addressed:  1. Essential hypertension - metoprolol (TOPROL-XL) 200 MG 24 hr tablet; Take 1 tablet (200 mg total) by mouth daily. (Needs to be seen before next refill)  Dispense: 90 tablet; Refill: 1 - amLODipine-valsartan (EXFORGE) 10-320 MG tablet; Take 1 tablet by mouth daily.  Dispense: 90 tablet; Refill: 2 - CMP14+EGFR - CBC with Differential/Platelet  2. Hyperlipidemia, unspecified hyperlipidemia type - atorvastatin (LIPITOR) 20 MG tablet; Take 1 tablet (20 mg total) by mouth daily. (Needs to be seen before next refill)  Dispense: 90 tablet; Refill: 1 - CMP14+EGFR - CBC with Differential/Platelet - Lipid panel  3. Colon cancer screening - Ambulatory referral to Gastroenterology - CMP14+EGFR - CBC with Differential/Platelet  4. Gastroesophageal reflux disease, unspecified whether esophagitis present - CMP14+EGFR - CBC with Differential/Platelet  5. Arthritis - CMP14+EGFR - CBC with Differential/Platelet  6. Obesity (BMI 30.0-34.9) - CMP14+EGFR - CBC with Differential/Platelet  7. Annual physical exam  - metoprolol (TOPROL-XL) 200 MG 24 hr tablet; Take 1 tablet (200 mg total) by mouth daily. (Needs to be seen before next refill)  Dispense: 90 tablet; Refill: 1 - amLODipine-valsartan (EXFORGE) 10-320 MG tablet; Take 1 tablet by mouth daily.  Dispense: 90 tablet; Refill: 2 - atorvastatin (LIPITOR) 20 MG tablet; Take 1 tablet (20 mg total) by mouth daily. (Needs to be seen before next refill)  Dispense: 90 tablet; Refill: 1 - Ambulatory referral to Gastroenterology - CMP14+EGFR - CBC with Differential/Platelet - Lipid panel - PSA, total and free - TSH   Labs pending Health Maintenance reviewed Diet and exercise encouraged  Follow up plan: 6 months   Evelina Dun, [name]

## 2020-05-04 NOTE — Patient Instructions (Signed)

## 2020-05-05 LAB — CMP14+EGFR
ALT: 25 IU/L (ref 0–44)
AST: 26 IU/L (ref 0–40)
Albumin/Globulin Ratio: 2 (ref 1.2–2.2)
Albumin: 4.5 g/dL (ref 3.8–4.9)
Alkaline Phosphatase: 65 IU/L (ref 44–121)
BUN/Creatinine Ratio: 13 (ref 9–20)
BUN: 12 mg/dL (ref 6–24)
Bilirubin Total: 0.3 mg/dL (ref 0.0–1.2)
CO2: 26 mmol/L (ref 20–29)
Calcium: 9 mg/dL (ref 8.7–10.2)
Chloride: 104 mmol/L (ref 96–106)
Creatinine, Ser: 0.93 mg/dL (ref 0.76–1.27)
GFR calc Af Amer: 106 mL/min/{1.73_m2} (ref >59)
GFR calc non Af Amer: 92 mL/min/{1.73_m2} (ref >59)
Globulin, Total: 2.2 g/dL (ref 1.5–4.5)
Glucose: 88 mg/dL (ref 65–99)
Potassium: 4 mmol/L (ref 3.5–5.2)
Sodium: 142 mmol/L (ref 134–144)
Total Protein: 6.7 g/dL (ref 6.0–8.5)

## 2020-05-05 LAB — CBC WITH DIFFERENTIAL/PLATELET
Basophils Absolute: 0 x10E3/uL (ref 0.0–0.2)
Basos: 1 %
EOS (ABSOLUTE): 0.2 x10E3/uL (ref 0.0–0.4)
Eos: 3 %
Hematocrit: 41.4 % (ref 37.5–51.0)
Hemoglobin: 13.8 g/dL (ref 13.0–17.7)
Immature Grans (Abs): 0 x10E3/uL (ref 0.0–0.1)
Immature Granulocytes: 0 %
Lymphocytes Absolute: 2.1 x10E3/uL (ref 0.7–3.1)
Lymphs: 35 %
MCH: 27.5 pg (ref 26.6–33.0)
MCHC: 33.3 g/dL (ref 31.5–35.7)
MCV: 83 fL (ref 79–97)
Monocytes Absolute: 0.4 x10E3/uL (ref 0.1–0.9)
Monocytes: 7 %
Neutrophils Absolute: 3.1 x10E3/uL (ref 1.4–7.0)
Neutrophils: 54 %
Platelets: 210 x10E3/uL (ref 150–450)
RBC: 5.02 x10E6/uL (ref 4.14–5.80)
RDW: 13.8 % (ref 11.6–15.4)
WBC: 5.8 x10E3/uL (ref 3.4–10.8)

## 2020-05-05 LAB — LIPID PANEL
Chol/HDL Ratio: 4.9 ratio (ref 0.0–5.0)
Cholesterol, Total: 158 mg/dL (ref 100–199)
HDL: 32 mg/dL — ABNORMAL LOW (ref >39)
LDL Chol Calc (NIH): 77 mg/dL (ref 0–99)
Triglycerides: 304 mg/dL — ABNORMAL HIGH (ref 0–149)
VLDL Cholesterol Cal: 49 mg/dL — ABNORMAL HIGH (ref 5–40)

## 2020-05-05 LAB — TSH: TSH: 1.41 u[IU]/mL (ref 0.450–4.500)

## 2020-05-05 LAB — PSA, TOTAL AND FREE
PSA, Free Pct: 26.7 %
PSA, Free: 0.16 ng/mL
Prostate Specific Ag, Serum: 0.6 ng/mL (ref 0.0–4.0)

## 2020-07-13 ENCOUNTER — Ambulatory Visit (AMBULATORY_SURGERY_CENTER): Payer: BC Managed Care – PPO

## 2020-07-13 VITALS — Ht 70.0 in | Wt 246.0 lb

## 2020-07-13 DIAGNOSIS — Z1211 Encounter for screening for malignant neoplasm of colon: Secondary | ICD-10-CM

## 2020-07-13 MED ORDER — PEG 3350-KCL-NA BICARB-NACL 420 G PO SOLR: 4000.0000 mL | Freq: Once | ORAL | 0 refills | Status: AC

## 2020-07-13 NOTE — Progress Notes (Signed)

## 2020-07-23 ENCOUNTER — Telehealth: Payer: Self-pay | Admitting: Gastroenterology

## 2020-07-23 NOTE — Telephone Encounter (Signed)
Good morning Dr. Russella Dar, patient called requesting to cancel his procedure for 07/27/20 stating something came up where he will need to go out of town.  He will call at a later date to reschedule.

## 2020-07-27 ENCOUNTER — Encounter: Payer: Self-pay | Admitting: Gastroenterology

## 2020-08-08 ENCOUNTER — Encounter: Payer: Self-pay | Admitting: Family Medicine

## 2020-10-12 ENCOUNTER — Encounter: Payer: Self-pay | Admitting: Gastroenterology

## 2020-10-14 ENCOUNTER — Other Ambulatory Visit: Payer: Self-pay | Admitting: Family

## 2020-10-14 DIAGNOSIS — M109 Gout, unspecified: Secondary | ICD-10-CM

## 2020-10-29 ENCOUNTER — Ambulatory Visit: Payer: BC Managed Care – PPO | Admitting: Family

## 2020-10-29 ENCOUNTER — Encounter: Payer: Self-pay | Admitting: Family

## 2020-10-29 ENCOUNTER — Other Ambulatory Visit: Payer: Self-pay

## 2020-10-29 VITALS — BP 134/85 | HR 60 | Temp 98.2°F | Wt 237.0 lb

## 2020-10-29 DIAGNOSIS — E669 Obesity, unspecified: Secondary | ICD-10-CM

## 2020-10-29 DIAGNOSIS — Z23 Encounter for immunization: Secondary | ICD-10-CM

## 2020-10-29 DIAGNOSIS — K219 Gastro-esophageal reflux disease without esophagitis: Secondary | ICD-10-CM

## 2020-10-29 DIAGNOSIS — M109 Gout, unspecified: Secondary | ICD-10-CM

## 2020-10-29 DIAGNOSIS — S30861A Insect bite (nonvenomous) of abdominal wall, initial encounter: Secondary | ICD-10-CM

## 2020-10-29 DIAGNOSIS — W57XXXA Bitten or stung by nonvenomous insect and other nonvenomous arthropods, initial encounter: Secondary | ICD-10-CM

## 2020-10-29 DIAGNOSIS — E785 Hyperlipidemia, unspecified: Secondary | ICD-10-CM

## 2020-10-29 DIAGNOSIS — F411 Generalized anxiety disorder: Secondary | ICD-10-CM

## 2020-10-29 DIAGNOSIS — I10 Essential (primary) hypertension: Secondary | ICD-10-CM

## 2020-10-29 DIAGNOSIS — M1990 Unspecified osteoarthritis, unspecified site: Secondary | ICD-10-CM

## 2020-10-29 MED ORDER — ATORVASTATIN CALCIUM 20 MG PO TABS: 20.0000 mg | ORAL_TABLET | Freq: Every day | ORAL | 1 refills | Status: DC

## 2020-10-29 MED ORDER — METOPROLOL SUCCINATE ER 200 MG PO TB24
200.0000 mg | ORAL_TABLET | Freq: Every day | ORAL | 1 refills | Status: DC
Start: 2020-10-29 — End: 2021-09-03

## 2020-10-29 MED ORDER — COLCHICINE 0.6 MG PO TABS: ORAL_TABLET | ORAL | 0 refills | Status: DC

## 2020-10-29 MED ORDER — AMLODIPINE BESYLATE-VALSARTAN 10-320 MG PO TABS
1.0000 | ORAL_TABLET | Freq: Every day | ORAL | 2 refills | Status: DC
Start: 2020-10-29 — End: 2021-09-23

## 2020-10-29 NOTE — Progress Notes (Signed)
Subjective:    Patient ID: Ledarius Leeson, male    DOB: 1965-06-13, 55 y.o.   MRN: [id number]  Chief Complaint  Patient presents with   Medical Management of Chronic Issues   Pt presents to the office today for chronic follow up. He reports he had a tick bite end of May on his lower abdomen. Denies rash, fever, or headaches. He reports he still has a small erythemas mark there and had joint pain in June.  Hypertension This is a chronic problem. The current episode started more than 1 year ago. The problem has been resolved since onset. The problem is controlled. Associated symptoms include anxiety. Pertinent negatives include no malaise/fatigue, peripheral edema or shortness of breath. Risk factors for coronary artery disease include dyslipidemia, obesity and male gender. The current treatment provides moderate improvement. There is no history of heart failure.  Gastroesophageal Reflux He complains of belching and heartburn. This is a chronic problem. The current episode started more than 1 year ago. The problem has been waxing and waning. Risk factors include obesity. He has tried a PPI for the symptoms. The treatment provided moderate relief.  Arthritis Presents for follow-up visit. He complains of pain and stiffness. The symptoms have been stable. Affected locations include the left MCP, right MCP, right knee and left knee. His pain is at a severity of 2/10.  Hyperlipidemia This is a chronic problem. The current episode started more than 1 year ago. Exacerbating diseases include obesity. Pertinent negatives include no shortness of breath. Current antihyperlipidemic treatment includes statins. The current treatment provides moderate improvement of lipids. Risk factors for coronary artery disease include dyslipidemia, male sex, hypertension and a sedentary lifestyle.  Anxiety Presents for follow-up visit. Symptoms include depressed mood, excessive worry, irritability and nervous/anxious  behavior. Patient reports no shortness of breath. Symptoms occur most days. The severity of symptoms is moderate.   Gout  Last flare up was first June. Takes colchine as needed.     Review of Systems  Constitutional:  Positive for irritability. Negative for malaise/fatigue.  Respiratory:  Negative for shortness of breath.   Gastrointestinal:  Positive for heartburn.  Musculoskeletal:  Positive for arthritis and stiffness.  Psychiatric/Behavioral:  The patient is nervous/anxious.   All other systems reviewed and are negative.     Objective:   Physical Exam Vitals reviewed.  Constitutional:      General: He is not in acute distress.    Appearance: He is well-developed. He is obese.  HENT:     Head: Normocephalic.     Right Ear: Tympanic membrane normal.     Left Ear: Tympanic membrane normal.  Eyes:     General:        Right eye: No discharge.        Left eye: No discharge.     Pupils: Pupils are equal, round, and reactive to light.  Neck:     Thyroid: No thyromegaly.  Cardiovascular:     Rate and Rhythm: Normal rate and regular rhythm.     Heart sounds: Normal heart sounds. No murmur heard. Pulmonary:     Effort: Pulmonary effort is normal. No respiratory distress.     Breath sounds: Normal breath sounds. No wheezing.  Abdominal:     General: Bowel sounds are normal. There is no distension.     Palpations: Abdomen is soft.     Tenderness: There is no abdominal tenderness.  Musculoskeletal:        General: No tenderness. Normal range  of motion.     Cervical back: Normal range of motion and neck supple.  Skin:    General: Skin is warm and dry.     Findings: No erythema or rash.  Neurological:     Mental Status: He is alert and oriented to person, place, and time.     Cranial Nerves: No cranial nerve deficit.     Deep Tendon Reflexes: Reflexes are normal and symmetric.  Psychiatric:        Behavior: Behavior normal.        Thought Content: Thought content normal.         Judgment: Judgment normal.      BP 134/85   Pulse 60   Temp 98.2 F (36.8 C) (Temporal)   Wt 237 lb (107.5 kg)   SpO2 97%   BMI 34.01 kg/m      Assessment & Plan:  Andoni Busch comes in today with chief complaint of Medical Management of Chronic Issues   Diagnosis and orders addressed:  1. Essential hypertension - metoprolol (TOPROL-XL) 200 MG 24 hr tablet; Take 1 tablet (200 mg total) by mouth daily. (Needs to be seen before next refill)  Dispense: 90 tablet; Refill: 1 - amLODipine-valsartan (EXFORGE) 10-320 MG tablet; Take 1 tablet by mouth daily.  Dispense: 90 tablet; Refill: 2 - CMP14+EGFR   3. Hyperlipidemia, unspecified hyperlipidemia type - atorvastatin (LIPITOR) 20 MG tablet; Take 1 tablet (20 mg total) by mouth daily. (Needs to be seen before next refill)  Dispense: 90 tablet; Refill: 1 - CMP14+EGFR  4. Need for zoster vaccination - CMP14+EGFR  5. Gastroesophageal reflux disease, unspecified whether esophagitis present - CMP14+EGFR  6. Obesity (BMI 30.0-34.9) - CMP14+EGFR  7. GAD (generalized anxiety disorder) - CMP14+EGFR  8. Arthritis - CMP14+EGFR  9. Acute gout involving toe of right foot, unspecified cause Colchicine as needed - CMP14+EGFR - Uric acid - colchicine 0.6 MG tablet; Take 1.2 mg then 1 hour later 0.6 mg. Max 1.8 mg//24 hours  Dispense: 21 tablet; Refill: 0  10. Tick bite of abdominal wall, initial encounter -Pt to report any new fever, joint pain, or rash -Wear protective clothing while outside- Long sleeves and long pants -Put insect repellent on all exposed skin and along clothing -Take a shower as soon as possible after being outside - CMP14+EGFR - [place] mtn spotted fvr abs pnl(IgG+IgM) - Alpha-Gal Panel - Lyme Disease Serology w/Reflex   Labs pending Health Maintenance reviewed Diet and exercise encouraged  Follow up plan: 6 months    Evelina Dun, [name]

## 2020-10-29 NOTE — Patient Instructions (Signed)
Gout  Gout is a condition that causes painful swelling of the joints. Gout is a type of inflammation of the joints (arthritis). This condition is caused by having too much uric acid in the body. Uric acid is a chemical that forms when the body breaks down substances called purines.Purines are important for building body proteins. When the body has too much uric acid, sharp crystals can form and build up inside the joints. This causes pain and swelling. Gout attacks can happen quickly and may be very painful (acute gout). Over time, the attacks can affect more joints and become more frequent (chronic gout). Gout can also cause uric acid to build up under the skin and inside thekidneys. What are the causes? This condition is caused by too much uric acid in your blood. This can happen because: Your kidneys do not remove enough uric acid from your blood. This is the most common cause. Your body makes too much uric acid. This can happen with some cancers and cancer treatments. It can also occur if your body is breaking down too many red blood cells (hemolytic anemia). You eat too many foods that are high in purines. These foods include organ meats and some seafood. Alcohol, especially beer, is also high in purines. A gout attack may be triggered by trauma or stress. What increases the risk? You are more likely to develop this condition if you: Have a family history of gout. Are male and middle-aged. Are male and have gone through menopause. Are obese. Frequently drink alcohol, especially beer. Are dehydrated. Lose weight too quickly. Have an organ transplant. Have lead poisoning. Take certain medicines, including aspirin, cyclosporine, diuretics, levodopa, and niacin. Have kidney disease. Have a skin condition called psoriasis. What are the signs or symptoms? An attack of acute gout happens quickly. It usually occurs in just one joint. The most common place is the big toe. Attacks often start  at night. Other joints that may be affected include joints of the feet, ankle, knee, fingers, wrist, or elbow. Symptoms of this condition may include: Severe pain. Warmth. Swelling. Stiffness. Tenderness. The affected joint may be very painful to touch. Shiny, red, or purple skin. Chills and fever. Chronic gout may cause symptoms more frequently. More joints may be involved. You may also have white or yellow lumps (tophi) on your hands or feet or in other areas near your joints. How is this diagnosed? This condition is diagnosed based on your symptoms, medical history, and physical exam. You may have tests, such as: Blood tests to measure uric acid levels. Removal of joint fluid with a thin needle (aspiration) to look for uric acid crystals. X-rays to look for joint damage. How is this treated? Treatment for this condition has two phases: treating an acute attack and preventing future attacks. Acute gout treatment may include medicines to reduce pain and swelling, including: NSAIDs. Steroids. These are strong anti-inflammatory medicines that can be taken by mouth (orally) or injected into a joint. Colchicine. This medicine relieves pain and swelling when it is taken soon after an attack. It can be given by mouth or through an IV. Preventive treatment may include: Daily use of smaller doses of NSAIDs or colchicine. Use of a medicine that reduces uric acid levels in your blood. Changes to your diet. You may need to see a dietitian about what to eat and drink to prevent gout. Follow these instructions at home: During a gout attack  If directed, put ice on the affected area: Put   ice in a plastic bag. Place a towel between your skin and the bag. Leave the ice on for 20 minutes, 2-3 times a day. Raise (elevate) the affected joint above the level of your heart as often as possible. Rest the joint as much as possible. If the affected joint is in your leg, you may be given crutches to  use. Follow instructions from your health care provider about eating or drinking restrictions.  Avoiding future gout attacks Follow a low-purine diet as told by your dietitian or health care provider. Avoid foods and drinks that are high in purines, including liver, kidney, anchovies, asparagus, herring, mushrooms, mussels, and beer. Maintain a healthy weight or lose weight if you are overweight. If you want to lose weight, talk with your health care provider. It is important that you do not lose weight too quickly. Start or maintain an exercise program as told by your health care provider. Eating and drinking Drink enough fluids to keep your urine pale yellow. If you drink alcohol: Limit how much you use to: 0-1 drink a day for women. 0-2 drinks a day for men. Be aware of how much alcohol is in your drink. In the [place] one drink equals one 12 oz bottle of beer (355 mL) one 5 oz glass of wine (148 mL), or one 1 oz glass of hard liquor (44 mL). General instructions Take over-the-counter and prescription medicines only as told by your health care provider. Do not drive or use heavy machinery while taking prescription pain medicine. Return to your normal activities as told by your health care provider. Ask your health care provider what activities are safe for you. Keep all follow-up visits as told by your health care provider. This is important. Contact a health care provider if you have: Another gout attack. Continuing symptoms of a gout attack after 10 days of treatment. Side effects from your medicines. Chills or a fever. Burning pain when you urinate. Pain in your lower back or belly. Get help right away if you: Have severe or uncontrolled pain. Cannot urinate. Summary Gout is painful swelling of the joints caused by inflammation. The most common site of pain is the big toe, but it can affect other joints in the body. Medicines and dietary changes can help to prevent and treat gout  attacks. This information is not intended to replace advice given to you by your health care provider. Make sure you discuss any questions you have with your healthcare provider. Document Revised: 10/14/2017 Document Reviewed: 10/14/2017 Elsevier Patient Education  2022 [employer] Bunion A bunion (hallux valgus) is a bump that forms slowly on the inner side of the big toe joint. It occurs when the big toe turns toward the second toe. Bunions may be small at first,but they often get larger over time. They can make walking painful. What are the causes? This condition may be caused by: Wearing narrow or pointed shoes that force the big toe to press against the other toes. Abnormal foot development that causes the foot to roll inward. Changes in the foot that are caused by certain diseases, such as rheumatoid arthritis or polio. A foot injury. What increases the risk? The following factors may make you more likely to develop this condition: Wearing shoes that squeeze the toes together. Having certain diseases, such as: Rheumatoid arthritis. Polio. Cerebral palsy. Having family members who have bunions. Being born with abnormally shaped feet (a foot deformity), such as flat feet or low arches. Doing activities that  put a lot of pressure on the feet, such as ballet dancing. What are the signs or symptoms?  The main symptom of this condition is a bump on your big toe that you cannotice. Other symptoms may include: Pain. Redness and inflammation around your big toe. Thick or hardened skin on your big toe or between your toes. Stiffness or loss of motion in your big toe. Trouble with walking. How is this diagnosed? This condition may be diagnosed based on your symptoms, medical history, and activities. You may also have tests and imaging, such as: X-rays. These allow your health care provider to check the position of the bones in your foot and look for damage to your joint. They also help  your health care provider determine the severity of your bunion and the best way to treat it. Joint aspiration. In this test, a sample of fluid is removed from the toe joint. This test may be done if you are in a lot of pain. It helps rule out diseases that cause painful swelling of the joints, such as arthritis or gout. How is this treated? Treatment depends on the severity of your symptoms. The goal of treatment is to relieve symptoms and prevent your bunion from getting worse. Your health care provider may recommend: Wearing shoes that have a wide toe box, or using bunion pads to cushion the affected area. Taping your toes together to keep them in a normal position. Placing a device inside your shoe (orthotic device) to help reduce pressure on your toe joint. Taking medicine to ease pain and inflammation. Putting ice or heat on the affected area. Doing stretching exercises. Surgery, for severe cases. Follow these instructions at home: Managing pain, stiffness, and swelling     If directed, put ice on the painful area. To do this: Put ice in a plastic bag. Place a towel between your skin and the bag. Leave the ice on for 20 minutes, 2-3 times a day. Remove the ice if your skin turns bright red. This is very important. If you cannot feel pain, heat, or cold, you have a greater risk of damage to the area. If directed, apply heat to the affected area before you exercise. Use the heat source that your health care provider recommends, such as a moist heat pack or a heating pad. Place a towel between your skin and the heat source. Leave the heat on for 20-30 minutes. Remove the heat if your skin turns bright red. This is especially important if you are unable to feel pain, heat, or cold. You have a greater risk of getting burned. General instructions Do exercises as told by your health care provider. Support your toe joint with proper footwear, shoe padding, or taping as told by your health  care provider. Take over-the-counter and prescription medicines only as told by your health care provider. Do not use any products that contain nicotine or tobacco, such as cigarettes, e-cigarettes, and chewing tobacco. If you need help quitting, ask your health care provider. Keep all follow-up visits. This is important. Contact a health care provider if: Your symptoms get worse. Your symptoms do not improve in 2 weeks. Get help right away if: You have severe pain and trouble with walking. Summary A bunion is a bump on the inner side of the big toe joint that forms when the big toe turns toward the second toe. Bunions can make walking painful. Treatment depends on the severity of your symptoms. Support your toe joint with proper  footwear, shoe padding, or taping as told by your health care provider. This information is not intended to replace advice given to you by your health care provider. Make sure you discuss any questions you have with your healthcare provider. Document Revised: 07/29/2019 Document Reviewed: 07/29/2019 Elsevier Patient Education  2022 [employer].

## 2020-11-01 LAB — ALPHA-GAL PANEL
Allergen Lamb IgE: <0.1 kU/L
Beef IgE: 0.16 kU/L — AB
IgE (Immunoglobulin E), Serum: 187 IU/mL (ref 6–495)
O215-IgE Alpha-Gal: 0.8 kU/L — AB
Pork IgE: <0.1 kU/L

## 2020-11-01 LAB — CMP14+EGFR
ALT: 31 IU/L (ref 0–44)
AST: 22 IU/L (ref 0–40)
Albumin/Globulin Ratio: 1.8 (ref 1.2–2.2)
Albumin: 4.8 g/dL (ref 3.8–4.9)
Alkaline Phosphatase: 67 IU/L (ref 44–121)
BUN/Creatinine Ratio: 14 (ref 9–20)
BUN: 16 mg/dL (ref 6–24)
Bilirubin Total: 0.4 mg/dL (ref 0.0–1.2)
CO2: 23 mmol/L (ref 20–29)
Calcium: 9.1 mg/dL (ref 8.7–10.2)
Chloride: 104 mmol/L (ref 96–106)
Creatinine, Ser: 1.11 mg/dL (ref 0.76–1.27)
Globulin, Total: 2.7 g/dL (ref 1.5–4.5)
Glucose: 85 mg/dL (ref 65–99)
Potassium: 4.1 mmol/L (ref 3.5–5.2)
Sodium: 142 mmol/L (ref 134–144)
Total Protein: 7.5 g/dL (ref 6.0–8.5)
eGFR: 78 mL/min/{1.73_m2} (ref >59)

## 2020-11-01 LAB — LYME DISEASE SEROLOGY W/REFLEX: Lyme Total Antibody EIA: NEGATIVE

## 2020-11-01 LAB — ROCKY MTN SPOTTED FVR ABS PNL(IGG+IGM)
RMSF IgG: NEGATIVE
RMSF IgM: 0.64 index (ref 0.00–0.89)

## 2020-11-01 LAB — URIC ACID: Uric Acid: 7.5 mg/dL (ref 3.8–8.4)

## 2021-04-29 ENCOUNTER — Encounter: Payer: Self-pay | Admitting: Family

## 2021-04-29 ENCOUNTER — Ambulatory Visit (INDEPENDENT_AMBULATORY_CARE_PROVIDER_SITE_OTHER): Payer: BC Managed Care – PPO | Admitting: Family

## 2021-04-29 VITALS — BP 125/82 | HR 58 | Temp 97.0°F | Ht 70.0 in | Wt 245.0 lb

## 2021-04-29 DIAGNOSIS — Z23 Encounter for immunization: Secondary | ICD-10-CM | POA: Diagnosis not present

## 2021-04-29 DIAGNOSIS — I10 Essential (primary) hypertension: Secondary | ICD-10-CM

## 2021-04-29 DIAGNOSIS — Z0001 Encounter for general adult medical examination with abnormal findings: Secondary | ICD-10-CM

## 2021-04-29 DIAGNOSIS — Z1211 Encounter for screening for malignant neoplasm of colon: Secondary | ICD-10-CM

## 2021-04-29 DIAGNOSIS — F411 Generalized anxiety disorder: Secondary | ICD-10-CM

## 2021-04-29 DIAGNOSIS — K219 Gastro-esophageal reflux disease without esophagitis: Secondary | ICD-10-CM

## 2021-04-29 DIAGNOSIS — R079 Chest pain, unspecified: Secondary | ICD-10-CM

## 2021-04-29 DIAGNOSIS — M1990 Unspecified osteoarthritis, unspecified site: Secondary | ICD-10-CM

## 2021-04-29 DIAGNOSIS — E785 Hyperlipidemia, unspecified: Secondary | ICD-10-CM

## 2021-04-29 DIAGNOSIS — Z0000 Encounter for general adult medical examination without abnormal findings: Secondary | ICD-10-CM

## 2021-04-29 MED ORDER — ASPIRIN 81 MG PO TBEC: 81.0000 mg | DELAYED_RELEASE_TABLET | Freq: Every day | ORAL | 4 refills | Status: DC

## 2021-04-29 NOTE — Progress Notes (Signed)
Subjective:    Patient ID: Benjamin Edwards, male    DOB: 02-21-1966, 56 y.o.   MRN: [id number]  Chief Complaint  Patient presents with   Medical Management of Chronic Issues   Pt presents to the office today for CPE. Pt is morbid obese with a BMI of 35 and HTN and hyperlipidemia.  Hypertension This is a chronic problem. The current episode started more than 1 year ago. The problem has been resolved since onset. The problem is controlled. Associated symptoms include anxiety and chest pain. Pertinent negatives include no malaise/fatigue, peripheral edema or shortness of breath. Risk factors for coronary artery disease include dyslipidemia, obesity and male gender. The current treatment provides moderate improvement.  Gastroesophageal Reflux He complains of belching, chest pain and heartburn. This is a chronic problem. The current episode started more than 1 year ago. The problem occurs occasionally. The problem has been waxing and waning. The symptoms are aggravated by certain foods. Risk factors include obesity. He has tried a PPI for the symptoms. The treatment provided moderate relief.  Arthritis Presents for follow-up visit. He complains of pain and stiffness. The symptoms have been stable. Affected locations include the right knee, left knee, left shoulder and right shoulder. His pain is at a severity of 7/10.  Hyperlipidemia This is a chronic problem. The current episode started more than 1 year ago. The problem is controlled. Exacerbating diseases include obesity. Associated symptoms include chest pain. Pertinent negatives include no shortness of breath. Current antihyperlipidemic treatment includes statins. The current treatment provides moderate improvement of lipids. Risk factors for coronary artery disease include dyslipidemia, male sex, hypertension and a sedentary lifestyle.  Anxiety Presents for follow-up visit. Symptoms include chest pain, excessive worry and nervous/anxious behavior.  Patient reports no irritability or shortness of breath. Symptoms occur most days. The severity of symptoms is moderate.    Chest Pain  This is a new problem. The current episode started 1 to 4 weeks ago. The onset quality is gradual. The problem has been resolved. The pain is present in the substernal region. The pain is moderate. The quality of the pain is described as dull. The pain does not radiate. Pertinent negatives include no malaise/fatigue or shortness of breath.  His past medical history is significant for hyperlipidemia and hypertension.     Review of Systems  Constitutional:  Negative for irritability and malaise/fatigue.  Respiratory:  Negative for shortness of breath.   Cardiovascular:  Positive for chest pain.  Gastrointestinal:  Positive for heartburn.  Musculoskeletal:  Positive for arthritis and stiffness.  Psychiatric/Behavioral:  The patient is nervous/anxious.   All other systems reviewed and are negative.  Family History  Problem Relation Age of Onset   Heart disease Father    Hyperlipidemia Father    Hypertension Father    Pancreatic cancer Father        started in pancreas, mets to stomach    Colon cancer Other 92   Esophageal cancer Paternal Aunt    Colon polyps Neg Hx    Rectal cancer Neg Hx    Stomach cancer Neg Hx    Social History   Socioeconomic History   Marital status: Married    Spouse name: Not on file   Number of children: Not on file   Years of education: Not on file   Highest education level: Not on file  Occupational History   Not on file  Tobacco Use   Smoking status: Never   Smokeless tobacco: Current  Types: Snuff  Vaping Use   Vaping Use: Never used  Substance and Sexual Activity   Alcohol use: Not Currently   Drug use: No   Sexual activity: Yes  Other Topics Concern   Not on file  Social History Narrative   Not on file   Social Determinants of Health   Financial Resource Strain: Not on file  Food Insecurity: Not  on file  Transportation Needs: Not on file  Physical Activity: Not on file  Stress: Not on file  Social Connections: Not on file       Objective:   Physical Exam Vitals reviewed.  Constitutional:      General: He is not in acute distress.    Appearance: He is well-developed. He is obese.  HENT:     Head: Normocephalic.     Right Ear: Tympanic membrane normal.     Left Ear: Tympanic membrane normal.  Eyes:     General:        Right eye: No discharge.        Left eye: No discharge.     Pupils: Pupils are equal, round, and reactive to light.  Neck:     Thyroid: No thyromegaly.  Cardiovascular:     Rate and Rhythm: Normal rate and regular rhythm.     Heart sounds: Normal heart sounds. No murmur heard. Pulmonary:     Effort: Pulmonary effort is normal. No respiratory distress.     Breath sounds: Normal breath sounds. No wheezing.  Abdominal:     General: Bowel sounds are normal. There is no distension.     Palpations: Abdomen is soft.     Tenderness: There is no abdominal tenderness.  Musculoskeletal:        General: No tenderness. Normal range of motion.     Cervical back: Normal range of motion and neck supple.  Skin:    General: Skin is warm and dry.     Findings: No erythema or rash.  Neurological:     Mental Status: He is alert and oriented to person, place, and time.     Cranial Nerves: No cranial nerve deficit.     Deep Tendon Reflexes: Reflexes are normal and symmetric.  Psychiatric:        Behavior: Behavior normal.        Thought Content: Thought content normal.        Judgment: Judgment normal.      BP 125/82    Pulse (!) 58    Temp (!) 97 F (36.1 C) (Temporal)    Ht $R'5\' 10"'gq$  (1.778 m)    Wt 245 lb (111.1 kg)    BMI 35.15 kg/m      Assessment & Plan:  Benjamin Edwards comes in today with chief complaint of Medical Management of Chronic Issues   Diagnosis and orders addressed:  1. Essential hypertension - CMP14+EGFR - CBC with  Differential/Platelet - Ambulatory referral to Cardiology  2. Gastroesophageal reflux disease, unspecified whether esophagitis present - CMP14+EGFR - CBC with Differential/Platelet  3. Arthritis - CMP14+EGFR - CBC with Differential/Platelet  4. Morbid obesity ([place]) - CMP14+EGFR - CBC with Differential/Platelet  5. Hyperlipidemia, unspecified hyperlipidemia type - CMP14+EGFR - CBC with Differential/Platelet - Lipid panel - Ambulatory referral to Cardiology  6. GAD (generalized anxiety disorder) - CMP14+EGFR - CBC with Differential/Platelet  7. Annual physical exam - CMP14+EGFR - CBC with Differential/Platelet - Lipid panel - PSA, total and free - TSH - aspirin 81 MG EC tablet; Take 1  tablet (81 mg total) by mouth daily. Swallow whole.  Dispense: 90 tablet; Refill: 4 - Cologuard  8. Colon cancer screening - CMP14+EGFR - CBC with Differential/Platelet - Cologuard  9. Need for immunization against influenza - Flu Vaccine QUAD 46mo+IM (Fluarix, Fluzone & Alfiuria Quad PF)  10. Chest pain, unspecified type - EKG 12-Lead - Ambulatory referral to Cardiology   Labs pending Health Maintenance reviewed Diet and exercise encouraged  Follow up plan: 6 months    Evelina Dun, [name]

## 2021-04-29 NOTE — Patient Instructions (Addendum)
Nonspecific Chest Pain, Adult °Chest pain is an uncomfortable, tight, or painful feeling in the chest. The pain can feel like a crushing, aching, or squeezing pressure. A person can feel a burning or tingling sensation. Chest pain can also be felt in your back, neck, jaw, shoulder, or arm. This pain can be worse when you move, sneeze, or take a deep breath. °Chest pain can be caused by a condition that is life-threatening. This must be treated right away. It can also be caused by something that is not life-threatening. If you have chest pain, it can be hard to know the difference, so it is important to get help right away to make sure that you do not have a serious condition. °Some life-threatening causes of chest pain include: °Heart attack. °A tear in the body's main blood vessel (aortic dissection). °Inflammation around your heart (pericarditis). °A problem in the lungs, such as a blood clot (pulmonary embolism) or a collapsed lung (pneumothorax). °Some non life-threatening causes of chest pain include: °Heartburn. °Anxiety or stress. °Damage to the bones, muscles, and cartilage that make up your chest wall. °Pneumonia or bronchitis. °Shingles infection (varicella-zoster virus). °Your chest pain may come and go. It may also be constant. Your health care provider will do tests and other studies to find the cause of your pain. Treatment will depend on the cause of your chest pain. °Follow these instructions at home: °Medicines °Take over-the-counter and prescription medicines only as told by your health care provider. °If you were prescribed an antibiotic medicine, take it as told by your health care provider. Do not stop taking the antibiotic even if you start to feel better. °Activity °Avoid any activities that cause chest pain. °Do not lift anything that is heavier than 10 lb (4.5 kg), or the limit that you are told, until your health care provider says that it is safe. °Rest as directed by your health care  provider. °Return to your normal activities only as told by your health care provider. Ask your health care provider what activities are safe for you. °Lifestyle °  °Do not use any products that contain nicotine or tobacco, such as cigarettes, e-cigarettes, and chewing tobacco. If you need help quitting, ask your health care provider. °Do not drink alcohol. °Make healthy lifestyle changes as recommended. These may include: °Getting regular exercise. Ask your health care provider to suggest some exercises that are safe for you. °Eating a heart-healthy diet. This includes plenty of fresh fruits and vegetables, whole grains, low-fat (lean) protein, and low-fat dairy products. A dietitian can help you find healthy eating options. °Maintaining a healthy weight. °Managing any other health conditions you may have, such as high blood pressure (hypertension) or diabetes. °Reducing stress, such as with yoga or relaxation techniques. °General instructions °Pay attention to any changes in your symptoms. °It is up to you to get the results of any tests that were done. Ask your health care provider, or the department that is doing the tests, when your results will be ready. °Keep all follow-up visits as told by your health care provider. This is important. °You may be asked to go for further testing if your chest pain does not go away. °Contact a health care provider if: °Your chest pain does not go away. °You feel depressed. °You have a fever. °You notice changes in your symptoms or develop new symptoms. °Get help right away if: °Your chest pain gets worse. °You have a cough that gets worse, or you cough up   blood. °You have severe pain in your abdomen. °You faint. °You have sudden, unexplained chest discomfort. °You have sudden, unexplained discomfort in your arms, back, neck, or jaw. °You have shortness of breath at any time. °You suddenly start to sweat, or your skin gets clammy. °You feel nausea or you vomit. °You suddenly  feel lightheaded or dizzy. °You have severe weakness, or unexplained weakness or fatigue. °Your heart begins to beat quickly, or it feels like it is skipping beats. °These symptoms may represent a serious problem that is an emergency. Do not wait to see if the symptoms will go away. Get medical help right away. Call your local emergency services (911 in the [place] Do not drive yourself to the hospital. °Summary °Chest pain can be caused by a condition that is serious and requires urgent treatment. It may also be caused by something that is not life-threatening. °Your health care provider may do lab tests and other studies to find the cause of your pain. °Follow your health care provider's instructions on taking medicines, making lifestyle changes, and getting emergency treatment if symptoms become worse. °Keep all follow-up visits as told by your health care provider. This includes visits for any further testing if your chest pain does not go away. °This information is not intended to replace advice given to you by your health care provider. Make sure you discuss any questions you have with your health care provider. °Document Revised: 06/07/2020 Document Reviewed: 06/07/2020 °Elsevier Patient Education © 2022 [employer] ° °

## 2021-04-30 LAB — CBC WITH DIFFERENTIAL/PLATELET
Basophils Absolute: 0.1 10*3/uL (ref 0.0–0.2)
Basos: 1 %
EOS (ABSOLUTE): 0.2 10*3/uL (ref 0.0–0.4)
Eos: 2 %
Hematocrit: 42.4 % (ref 37.5–51.0)
Hemoglobin: 14.8 g/dL (ref 13.0–17.7)
Immature Grans (Abs): 0 10*3/uL (ref 0.0–0.1)
Immature Granulocytes: 0 %
Lymphocytes Absolute: 2.3 10*3/uL (ref 0.7–3.1)
Lymphs: 33 %
MCH: 28.2 pg (ref 26.6–33.0)
MCHC: 34.9 g/dL (ref 31.5–35.7)
MCV: 81 fL (ref 79–97)
Monocytes Absolute: 0.5 10*3/uL (ref 0.1–0.9)
Monocytes: 7 %
Neutrophils Absolute: 4 10*3/uL (ref 1.4–7.0)
Neutrophils: 57 %
Platelets: 224 10*3/uL (ref 150–450)
RBC: 5.24 x10E6/uL (ref 4.14–5.80)
RDW: 13.1 % (ref 11.6–15.4)
WBC: 7 10*3/uL (ref 3.4–10.8)

## 2021-04-30 LAB — CMP14+EGFR
ALT: 33 IU/L (ref 0–44)
AST: 29 IU/L (ref 0–40)
Albumin/Globulin Ratio: 1.6 (ref 1.2–2.2)
Albumin: 4.5 g/dL (ref 3.8–4.9)
Alkaline Phosphatase: 68 IU/L (ref 44–121)
BUN/Creatinine Ratio: 13 (ref 9–20)
BUN: 14 mg/dL (ref 6–24)
Bilirubin Total: 0.4 mg/dL (ref 0.0–1.2)
CO2: 24 mmol/L (ref 20–29)
Calcium: 9.4 mg/dL (ref 8.7–10.2)
Chloride: 102 mmol/L (ref 96–106)
Creatinine, Ser: 1.1 mg/dL (ref 0.76–1.27)
Globulin, Total: 2.9 g/dL (ref 1.5–4.5)
Glucose: 90 mg/dL (ref 70–99)
Potassium: 4 mmol/L (ref 3.5–5.2)
Sodium: 140 mmol/L (ref 134–144)
Total Protein: 7.4 g/dL (ref 6.0–8.5)
eGFR: 79 mL/min/{1.73_m2} (ref >59)

## 2021-04-30 LAB — PSA, TOTAL AND FREE
PSA, Free Pct: 32 %
PSA, Free: 0.16 ng/mL
Prostate Specific Ag, Serum: 0.5 ng/mL (ref 0.0–4.0)

## 2021-04-30 LAB — TSH: TSH: 1.79 u[IU]/mL (ref 0.450–4.500)

## 2021-04-30 LAB — LIPID PANEL
Chol/HDL Ratio: 5.7 ratio — ABNORMAL HIGH (ref 0.0–5.0)
Cholesterol, Total: 183 mg/dL (ref 100–199)
HDL: 32 mg/dL — ABNORMAL LOW (ref >39)
LDL Chol Calc (NIH): 86 mg/dL (ref 0–99)
Triglycerides: 395 mg/dL — ABNORMAL HIGH (ref 0–149)
VLDL Cholesterol Cal: 65 mg/dL — ABNORMAL HIGH (ref 5–40)

## 2021-06-12 ENCOUNTER — Ambulatory Visit: Payer: Self-pay | Admitting: Cardiology

## 2021-07-24 ENCOUNTER — Encounter: Payer: Self-pay | Admitting: *Deleted

## 2021-07-24 ENCOUNTER — Ambulatory Visit (INDEPENDENT_AMBULATORY_CARE_PROVIDER_SITE_OTHER): Payer: BC Managed Care – PPO | Admitting: Cardiology

## 2021-07-24 ENCOUNTER — Encounter: Payer: Self-pay | Admitting: Cardiology

## 2021-07-24 VITALS — BP 132/78 | HR 64 | Ht 70.0 in | Wt 252.8 lb

## 2021-07-24 DIAGNOSIS — R079 Chest pain, unspecified: Secondary | ICD-10-CM

## 2021-07-24 DIAGNOSIS — E782 Mixed hyperlipidemia: Secondary | ICD-10-CM

## 2021-07-24 DIAGNOSIS — Z8249 Family history of ischemic heart disease and other diseases of the circulatory system: Secondary | ICD-10-CM

## 2021-07-24 DIAGNOSIS — R0789 Other chest pain: Secondary | ICD-10-CM | POA: Diagnosis not present

## 2021-07-24 DIAGNOSIS — I10 Essential (primary) hypertension: Secondary | ICD-10-CM | POA: Diagnosis not present

## 2021-07-24 NOTE — Progress Notes (Signed)
? ? ?Cardiology Office Note ? ?Date: 07/24/2021  ? ?ID: Benjamin Edwards, DOB [birth year], MRN [id number] ? ?PCP:  Sharion Balloon, [name]  ?Cardiologist:  Rozann Lesches, MD ?Electrophysiologist:  None  ? ?Chief Complaint  ?Patient presents with  ? History of chest pain  ? ? ?History of Present Illness: ?Benjamin Edwards is a 56 y.o. male referred for cardiology consultation by [name] NP for the evaluation of chest discomfort.  I reviewed the office note from January.  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  This lasted for a few minutes and ultimately went away.  He took an aspirin, but no other intervention.  This has not recurred since that time.  He reports compliance with his medical therapy at baseline.  No palpitations or syncope. ? ?He was seen in consultation back in 2019 and referred for a Lexiscan Myoview at that time for ischemic evaluation.  Testing was never completed.  He did undergo a stress test approximately 26 years ago. ? ?I reviewed his recent lab work. ? ?Past Medical History:  ?Diagnosis Date  ? Anxiety   ? History of atrial fibrillation   ? Documented age 84  ? Hyperlipidemia   ? Hypertension   ? Seasonal allergies   ? Spring / Fall  ? ? ?Past Surgical History:  ?Procedure Laterality Date  ? KNEE SURGERY Right 2020  ? meniscus torn, repaired  ? Skin graph left leg Right 1978  ? WISDOM TOOTH EXTRACTION    ? ? ?Current Outpatient Medications  ?Medication Sig Dispense Refill  ? amLODipine-valsartan (EXFORGE) 10-320 MG tablet Take 1 tablet by mouth daily. 90 tablet 2  ? aspirin 325 MG EC tablet Take 325 mg by mouth daily.    ? atorvastatin (LIPITOR) 20 MG tablet Take 1 tablet (20 mg total) by mouth daily. (Needs to be seen before next refill) 90 tablet 1  ? colchicine 0.6 MG tablet Take 1.2 mg then 1 hour later 0.6 mg. Max 1.8 mg//24 hours 21 tablet 0  ? metoprolol  (TOPROL-XL) 200 MG 24 hr tablet Take 1 tablet (200 mg total) by mouth daily. (Needs to be seen before next refill) 90 tablet 1  ? ?No current facility-administered medications for this visit.  ? ?Allergies:  Penicillins  ? ?Social History: The patient  reports that he has never smoked. He quit smokeless tobacco use about 3 months ago.  His smokeless tobacco use included snuff. He reports that he does not currently use alcohol. He reports that he does not use drugs.  ? ?Family History: The patient's family history includes Colon cancer (age of onset: 79) in an other family member; Esophageal cancer in his paternal aunt; Heart disease in his father; Hyperlipidemia in his father; Hypertension in his father; Pancreatic cancer in his father.  ? ?ROS: No palpitations or syncope. ? ?Physical Exam: ?VS:  BP 132/78   Pulse 64   Ht 5\' 10"  (1.778 m)   Wt 252 lb 12.8 oz (114.7 kg)   SpO2 92%   BMI 36.27 kg/m? , BMI Body mass index is 36.27 kg/m?. ? ?Wt Readings from Last 3 Encounters:  ?07/24/21 252 lb 12.8 oz (114.7 kg)  ?04/29/21 245 lb (111.1 kg)  ?10/29/20 237 lb (107.5 kg)  ?  ?General: Patient appears comfortable at rest. ?HEENT: Conjunctiva and lids normal. ?Neck: Supple, no elevated JVP or carotid  bruits, no thyromegaly. ?Lungs: Clear to auscultation, nonlabored breathing at rest. ?Cardiac: Regular rate and rhythm, no S3 or significant systolic murmur, no pericardial rub. ?Abdomen: Soft, nontender, bowel sounds present. ?Extremities: No pitting edema, distal pulses 2+. ?Skin: Warm and dry. ?Musculoskeletal: No kyphosis. ?Neuropsychiatric: Alert and oriented x3, affect grossly appropriate. ? ?ECG:  An ECG dated 04/29/2021 was personally reviewed today and demonstrated:  Sinus bradycardia. ? ?Recent Labwork: ?04/29/2021: ALT 33; AST 29; BUN 14; Creatinine, Ser 1.10; Hemoglobin 14.8; Platelets 224; Potassium 4.0; Sodium 140; TSH 1.790  ?   ?Component Value Date/Time  ? CHOL 183 04/29/2021 1549  ? TRIG 395 (H) 04/29/2021  1549  ? HDL 32 (L) 04/29/2021 1549  ? CHOLHDL 5.7 (H) 04/29/2021 1549  ? [place] 86 04/29/2021 1549  ? ? ?Other Studies Reviewed Today: ? ?No prior cardiac testing for review. ? ?Assessment and Plan: ? ?1.  Atypical chest pain in a 56 year old male with BMI 36, family history of premature CAD, and personal history of hypertension and hyperlipidemia.  He has not undergone any recent ischemic evaluation.  Medications reviewed.  Plan to proceed with a Lexiscan Myoview for further evaluation. ? ?2.  Remote history of atrial fibrillation without recurrence.  CHA2DS2-VASc score is 1.  Would continue with observation for now. ? ?3.  Mixed hyperlipidemia, on Lipitor.  Last LDL 86. ? ?4.  Essential hypertension, currently on Toprol-XL and Exforge.  Systolic is in the [id number] today.  No changes were made. ? ?Medication Adjustments/Labs and Tests Ordered: ?Current medicines are reviewed at length with the patient today.  Concerns regarding medicines are outlined above.  ? ?Tests Ordered: ?Orders Placed This Encounter  ?Procedures  ? NM Myocar Multi W/Spect W/Wall Motion / EF  ? ? ?Medication Changes: ?No orders of the defined types were placed in this encounter. ? ? ?Disposition:  Follow up  test results. ? ?Signed, ?Satira Sark, MD, [facility] ?07/24/2021 1:41 PM    ?[place] [facility] ?[place], [place], [place] [address] ?Phone: [phone]; Fax: [phone]  ?

## 2021-07-24 NOTE — Patient Instructions (Addendum)

## 2021-08-05 ENCOUNTER — Ambulatory Visit (HOSPITAL_COMMUNITY)
Admission: RE | Admit: 2021-08-05 | Discharge: 2021-08-05 | Disposition: A | Discharge status: Home / Self Care | Payer: BC Managed Care – PPO | Source: Ambulatory Visit | Attending: Cardiology | Admitting: Cardiology

## 2021-08-05 DIAGNOSIS — R079 Chest pain, unspecified: Secondary | ICD-10-CM | POA: Insufficient documentation

## 2021-08-05 LAB — NM MYOCAR MULTI W/SPECT W/WALL MOTION / EF
LV dias vol: 147 mL (ref 62–150)
LV sys vol: 80 mL
Nuc Stress EF: 46 %
Peak HR: 83 {beats}/min
RATE: 0.4
Rest HR: 54 {beats}/min
Rest Nuclear Isotope Dose: 10.3 mCi
SDS: 2
SRS: 2
SSS: 4
ST Depression (mm): 0 mm
Stress Nuclear Isotope Dose: 31.5 mCi
TID: 1.06

## 2021-08-05 MED ORDER — TECHNETIUM TC 99M TETROFOSMIN IV KIT
30.0000 | PACK | Freq: Once | INTRAVENOUS | Status: AC
  Administered 2021-08-05: 31.5 via INTRAVENOUS

## 2021-08-05 MED ORDER — REGADENOSON 0.4 MG/5ML IV SOLN
INTRAVENOUS | Status: AC
  Administered 2021-08-05: 0.4 mg
  Filled 2021-08-05: qty 5

## 2021-08-05 MED ORDER — TECHNETIUM TC 99M TETROFOSMIN IV KIT
10.0000 | PACK | Freq: Once | INTRAVENOUS | Status: AC | PRN
  Administered 2021-08-05: 10.31 via INTRAVENOUS

## 2021-08-05 MED ORDER — SODIUM CHLORIDE FLUSH 0.9 % IV SOLN
INTRAVENOUS | Status: AC
  Administered 2021-08-05: 10 mL
  Filled 2021-08-05: qty 10

## 2021-08-07 ENCOUNTER — Telehealth: Payer: Self-pay | Admitting: Cardiology

## 2021-08-07 DIAGNOSIS — Z8249 Family history of ischemic heart disease and other diseases of the circulatory system: Secondary | ICD-10-CM

## 2021-08-07 DIAGNOSIS — R079 Chest pain, unspecified: Secondary | ICD-10-CM

## 2021-08-07 DIAGNOSIS — I10 Essential (primary) hypertension: Secondary | ICD-10-CM

## 2021-08-07 NOTE — Telephone Encounter (Signed)
Patient returning call for stress test results. 

## 2021-08-08 NOTE — Telephone Encounter (Signed)
-----   Message from Satira Sark, MD sent at 08/05/2021  3:08 PM EDT ----- ?Results reviewed.  Stress testing does not show any clear evidence of ischemia to suggest obstructive CAD, also no scar.  Calculated LVEF is mildly reduced, but I would suggest getting an echocardiogram to clarify this further prior to making follow-up plans. ?

## 2021-08-08 NOTE — Telephone Encounter (Signed)
Patient informed. Copy sent to PCP °

## 2021-08-28 ENCOUNTER — Other Ambulatory Visit: Payer: BC Managed Care – PPO

## 2021-08-31 ENCOUNTER — Other Ambulatory Visit: Payer: Self-pay | Admitting: Family

## 2021-08-31 DIAGNOSIS — E785 Hyperlipidemia, unspecified: Secondary | ICD-10-CM

## 2021-08-31 DIAGNOSIS — I10 Essential (primary) hypertension: Secondary | ICD-10-CM

## 2021-09-03 ENCOUNTER — Ambulatory Visit (INDEPENDENT_AMBULATORY_CARE_PROVIDER_SITE_OTHER): Payer: BC Managed Care – PPO

## 2021-09-03 DIAGNOSIS — Z8249 Family history of ischemic heart disease and other diseases of the circulatory system: Secondary | ICD-10-CM | POA: Diagnosis not present

## 2021-09-03 DIAGNOSIS — I10 Essential (primary) hypertension: Secondary | ICD-10-CM

## 2021-09-03 DIAGNOSIS — R079 Chest pain, unspecified: Secondary | ICD-10-CM

## 2021-09-03 LAB — ECHOCARDIOGRAM COMPLETE
AV Vena cont: 0.29 cm
Area-P 1/2: 2.87 cm2
Calc EF: 60.6 %
MV M vel: 2.4 m/s
MV Peak grad: 23 mmHg
P 1/2 time: 1215 msec
S' Lateral: 3.35 cm
Single Plane A2C EF: 58.3 %
Single Plane A4C EF: 62.4 %

## 2021-09-21 ENCOUNTER — Other Ambulatory Visit: Payer: Self-pay | Admitting: Family

## 2021-09-21 DIAGNOSIS — I10 Essential (primary) hypertension: Secondary | ICD-10-CM

## 2021-10-28 ENCOUNTER — Ambulatory Visit (INDEPENDENT_AMBULATORY_CARE_PROVIDER_SITE_OTHER): Payer: BC Managed Care – PPO

## 2021-10-28 ENCOUNTER — Other Ambulatory Visit: Payer: Self-pay | Admitting: Family

## 2021-10-28 ENCOUNTER — Ambulatory Visit (INDEPENDENT_AMBULATORY_CARE_PROVIDER_SITE_OTHER): Payer: BC Managed Care – PPO | Admitting: Family

## 2021-10-28 ENCOUNTER — Encounter: Payer: Self-pay | Admitting: Family

## 2021-10-28 VITALS — BP 110/69 | HR 55 | Temp 97.9°F | Ht 70.0 in | Wt 249.2 lb

## 2021-10-28 DIAGNOSIS — M1990 Unspecified osteoarthritis, unspecified site: Secondary | ICD-10-CM

## 2021-10-28 DIAGNOSIS — M25511 Pain in right shoulder: Secondary | ICD-10-CM

## 2021-10-28 DIAGNOSIS — E785 Hyperlipidemia, unspecified: Secondary | ICD-10-CM

## 2021-10-28 DIAGNOSIS — K219 Gastro-esophageal reflux disease without esophagitis: Secondary | ICD-10-CM | POA: Diagnosis not present

## 2021-10-28 DIAGNOSIS — I10 Essential (primary) hypertension: Secondary | ICD-10-CM

## 2021-10-28 DIAGNOSIS — F411 Generalized anxiety disorder: Secondary | ICD-10-CM

## 2021-10-28 MED ORDER — DICLOFENAC SODIUM 75 MG PO TBEC: 75.0000 mg | DELAYED_RELEASE_TABLET | Freq: Two times a day (BID) | ORAL | 2 refills | Status: DC

## 2021-10-28 NOTE — Progress Notes (Signed)
Subjective:    Patient ID: Benjamin Edwards, male    DOB: 30-Nov-1965, 56 y.o.   MRN: [id number]  Chief Complaint  Patient presents with   Medical Management of Chronic Issues   Pt presents to the office today for CPE. Pt is morbid obese with a BMI of 35 and HTN and hyperlipidemia.  Hypertension This is a chronic problem. The current episode started more than 1 year ago. The problem has been resolved since onset. The problem is controlled. Associated symptoms include anxiety and malaise/fatigue. Pertinent negatives include no peripheral edema or shortness of breath. Risk factors for coronary artery disease include dyslipidemia, obesity and male gender. Past treatments include calcium channel blockers, angiotensin blockers and beta blockers. The current treatment provides moderate improvement.  Gastroesophageal Reflux He complains of belching and heartburn. This is a chronic problem. The current episode started more than 1 year ago. The problem occurs occasionally. The problem has been resolved.  Arthritis Presents for follow-up visit. He complains of pain and stiffness. The symptoms have been stable. Affected locations include the right knee and left knee. His pain is at a severity of 7/10.  Hyperlipidemia This is a chronic problem. The current episode started more than 1 year ago. The problem is controlled. Exacerbating diseases include obesity. Pertinent negatives include no shortness of breath. Current antihyperlipidemic treatment includes statins. The current treatment provides moderate improvement of lipids. Risk factors for coronary artery disease include dyslipidemia, male sex, hypertension and a sedentary lifestyle.  Anxiety Presents for follow-up visit. Symptoms include excessive worry, irritability and nervous/anxious behavior. Patient reports no shortness of breath. Symptoms occur occasionally. The severity of symptoms is mild.    Benign Prostatic Hypertrophy This is a new problem.  The current episode started more than 1 year ago. The problem has been waxing and waning since onset. Irritative symptoms include nocturia (3). Obstructive symptoms do not include incomplete emptying, an intermittent stream, a slower stream, straining or a weak stream. Pertinent negatives include no hematuria. Past treatments include nothing. The treatment provided no relief.      Review of Systems  Constitutional:  Positive for irritability and malaise/fatigue.  Respiratory:  Negative for shortness of breath.   Gastrointestinal:  Positive for heartburn.  Genitourinary:  Positive for nocturia (3). Negative for hematuria and incomplete emptying.  Musculoskeletal:  Positive for arthritis and stiffness.  Psychiatric/Behavioral:  The patient is nervous/anxious.   All other systems reviewed and are negative.      Objective:   Physical Exam Vitals reviewed.  Constitutional:      General: He is not in acute distress.    Appearance: He is well-developed. He is obese.  HENT:     Head: Normocephalic.     Right Ear: Tympanic membrane normal.     Left Ear: Tympanic membrane normal.  Eyes:     General:        Right eye: No discharge.        Left eye: No discharge.     Pupils: Pupils are equal, round, and reactive to light.  Neck:     Thyroid: No thyromegaly.  Cardiovascular:     Rate and Rhythm: Normal rate and regular rhythm.     Heart sounds: Normal heart sounds. No murmur heard. Pulmonary:     Effort: Pulmonary effort is normal. No respiratory distress.     Breath sounds: Normal breath sounds. No wheezing.  Abdominal:     General: Bowel sounds are normal. There is no distension.  Palpations: Abdomen is soft.     Tenderness: There is no abdominal tenderness.  Musculoskeletal:        General: No tenderness. Normal range of motion.     Cervical back: Normal range of motion and neck supple.     Comments: Pain in right shoulder with abduction   Skin:    General: Skin is warm and  dry.     Findings: No erythema or rash.  Neurological:     Mental Status: He is alert and oriented to person, place, and time.     Cranial Nerves: No cranial nerve deficit.     Deep Tendon Reflexes: Reflexes are normal and symmetric.  Psychiatric:        Behavior: Behavior normal.        Thought Content: Thought content normal.        Judgment: Judgment normal.       BP 110/69   Pulse (!) 55   Temp 97.9 F (36.6 C)   Ht $R'5\' 10"'VK$  (1.778 m)   Wt 249 lb 3.2 oz (113 kg)   SpO2 96%   BMI 35.76 kg/m      Assessment & Plan:  Albertus Chiarelli comes in today with chief complaint of Medical Management of Chronic Issues   Diagnosis and orders addressed:  1. Essential hypertension - CMP14+EGFR - CBC with Differential/Platelet  2. Gastroesophageal reflux disease, unspecified whether esophagitis present - CMP14+EGFR - CBC with Differential/Platelet  3. Arthritis  - CMP14+EGFR - CBC with Differential/Platelet - DG Shoulder Right - diclofenac (VOLTAREN) 75 MG EC tablet; Take 1 tablet (75 mg total) by mouth 2 (two) times daily.  Dispense: 60 tablet; Refill: 2  4. Morbid obesity ([place]) - CMP14+EGFR - CBC with Differential/Platelet  5. GAD (generalized anxiety disorder) - CMP14+EGFR - CBC with Differential/Platelet  6. Hyperlipidemia, unspecified hyperlipidemia type - CMP14+EGFR - CBC with Differential/Platelet  7. Acute pain of right shoulder - CMP14+EGFR - CBC with Differential/Platelet - DG Shoulder Right - diclofenac (VOLTAREN) 75 MG EC tablet; Take 1 tablet (75 mg total) by mouth 2 (two) times daily.  Dispense: 60 tablet; Refill: 2   Labs pending Health Maintenance reviewed Diet and exercise encouraged  Follow up plan: 6 months   Evelina Dun, [name]

## 2021-10-28 NOTE — Patient Instructions (Signed)
Osteoarthritis  Osteoarthritis is a type of arthritis. It refers to joint pain or joint disease. Osteoarthritis affects tissue that covers the ends of bones in joints (cartilage). Cartilage acts as a cushion between the bones and helps them move smoothly. Osteoarthritis occurs when cartilage in the joints gets worn down. Osteoarthritis is sometimes called "wear and tear" arthritis. Osteoarthritis is the most common form of arthritis. It often occurs in older people. It is a condition that gets worse over time. The joints most often affected by this condition are in the fingers, toes, hips, knees, and spine, including the neck and lower back. What are the causes? This condition is caused by the wearing down of cartilage that covers the ends of bones. What increases the risk? The following factors may make you more likely to develop this condition: Being age 50 or older. Obesity. Overuse of joints. Past injury of a joint. Past surgery on a joint. Family history of osteoarthritis. What are the signs or symptoms? The main symptoms of this condition are pain, swelling, and stiffness in the joint. Other symptoms may include: An enlarged joint. More pain and further damage caused by small pieces of bone or cartilage that break off and float inside of the joint. Small deposits of bone (osteophytes) that grow on the edges of the joint. A grating or scraping feeling inside the joint when you move it. Popping or creaking sounds when you move. Difficulty walking or exercising. An inability to grip items, twist your hand(s), or control the movements of your hands and fingers. How is this diagnosed? This condition may be diagnosed based on: Your medical history. A physical exam. Your symptoms. X-rays of the affected joint(s). Blood tests to rule out other types of arthritis. How is this treated? There is no cure for this condition, but treatment can help control pain and improve joint function.  Treatment may include a combination of therapies, such as: Pain relief techniques, such as: Applying heat and cold to the joint. Massage. A form of talk therapy called cognitive behavioral therapy (CBT). This therapy helps you set goals and follow up on the changes that you make. Medicines for pain and inflammation. The medicines can be taken by mouth or applied to the skin. They include: NSAIDs, such as ibuprofen. Prescription medicines. Strong anti-inflammatory medicines (corticosteroids). Certain nutritional supplements. A prescribed exercise program. You may work with a physical therapist. Assistive devices, such as a brace, wrap, splint, specialized glove, or cane. A weight control plan. Surgery, such as: An osteotomy. This is done to reposition the bones and relieve pain or to remove loose pieces of bone and cartilage. Joint replacement surgery. You may need this surgery if you have advanced osteoarthritis. Follow these instructions at home: Activity Rest your affected joints as told by your health care provider. Exercise as told by your health care provider. He or she may recommend specific types of exercise, such as: Strengthening exercises. These are done to strengthen the muscles that support joints affected by arthritis. Aerobic activities. These are exercises, such as brisk walking or water aerobics, that increase your heart rate. Range-of-motion activities. These help your joints move more easily. Balance and agility exercises. Managing pain, stiffness, and swelling     If directed, apply heat to the affected area as often as told by your health care provider. Use the heat source that your health care provider recommends, such as a moist heat pack or a heating pad. If you have a removable assistive device, remove it   as told by your health care provider. Place a towel between your skin and the heat source. If your health care provider tells you to keep the assistive device  on while you apply heat, place a towel between the assistive device and the heat source. Leave the heat on for 20-30 minutes. Remove the heat if your skin turns bright red. This is especially important if you are unable to feel pain, heat, or cold. You may have a greater risk of getting burned. If directed, put ice on the affected area. To do this: If you have a removable assistive device, remove it as told by your health care provider. Put ice in a plastic bag. Place a towel between your skin and the bag. If your health care provider tells you to keep the assistive device on during icing, place a towel between the assistive device and the bag. Leave the ice on for 20 minutes, 2-3 times a day. Move your fingers or toes often to reduce stiffness and swelling. Raise (elevate) the injured area above the level of your heart while you are sitting or lying down. General instructions Take over-the-counter and prescription medicines only as told by your health care provider. Maintain a healthy weight. Follow instructions from your health care provider for weight control. Do not use any products that contain nicotine or tobacco, such as cigarettes, e-cigarettes, and chewing tobacco. If you need help quitting, ask your health care provider. Use assistive devices as told by your health care provider. Keep all follow-up visits as told by your health care provider. This is important. Where to find more information National Institute of Arthritis and Musculoskeletal and Skin Diseases: www.niams.nih.gov National Institute on Aging: www.nia.nih.gov American College of Rheumatology: www.rheumatology.org Contact a health care provider if: You have redness, swelling, or a feeling of warmth in a joint that gets worse. You have a fever along with joint or muscle aches. You develop a rash. You have trouble doing your normal activities. Get help right away if: You have pain that gets worse and is not relieved by  pain medicine. Summary Osteoarthritis is a type of arthritis that affects tissue covering the ends of bones in joints (cartilage). This condition is caused by the wearing down of cartilage that covers the ends of bones. The main symptom of this condition is pain, swelling, and stiffness in the joint. There is no cure for this condition, but treatment can help control pain and improve joint function. This information is not intended to replace advice given to you by your health care provider. Make sure you discuss any questions you have with your health care provider. Document Revised: 03/21/2019 Document Reviewed: 03/21/2019 Elsevier Patient Education  2023 Elsevier Inc.  

## 2021-10-29 LAB — CMP14+EGFR
ALT: 29 IU/L (ref 0–44)
AST: 24 IU/L (ref 0–40)
Albumin/Globulin Ratio: 2.1 (ref 1.2–2.2)
Albumin: 4.8 g/dL (ref 3.8–4.9)
Alkaline Phosphatase: 66 IU/L (ref 44–121)
BUN/Creatinine Ratio: 10 (ref 9–20)
BUN: 11 mg/dL (ref 6–24)
Bilirubin Total: 0.5 mg/dL (ref 0.0–1.2)
CO2: 26 mmol/L (ref 20–29)
Calcium: 9.3 mg/dL (ref 8.7–10.2)
Chloride: 102 mmol/L (ref 96–106)
Creatinine, Ser: 1.11 mg/dL (ref 0.76–1.27)
Globulin, Total: 2.3 g/dL (ref 1.5–4.5)
Glucose: 132 mg/dL — ABNORMAL HIGH (ref 70–99)
Potassium: 3.9 mmol/L (ref 3.5–5.2)
Sodium: 142 mmol/L (ref 134–144)
Total Protein: 7.1 g/dL (ref 6.0–8.5)
eGFR: 77 mL/min/{1.73_m2} (ref >59)

## 2021-10-29 LAB — CBC WITH DIFFERENTIAL/PLATELET
Basophils Absolute: 0 10*3/uL (ref 0.0–0.2)
Basos: 1 %
EOS (ABSOLUTE): 0.2 10*3/uL (ref 0.0–0.4)
Eos: 3 %
Hematocrit: 44 % (ref 37.5–51.0)
Hemoglobin: 15 g/dL (ref 13.0–17.7)
Immature Grans (Abs): 0 10*3/uL (ref 0.0–0.1)
Immature Granulocytes: 0 %
Lymphocytes Absolute: 2.1 10*3/uL (ref 0.7–3.1)
Lymphs: 37 %
MCH: 27.8 pg (ref 26.6–33.0)
MCHC: 34.1 g/dL (ref 31.5–35.7)
MCV: 82 fL (ref 79–97)
Monocytes Absolute: 0.4 10*3/uL (ref 0.1–0.9)
Monocytes: 7 %
Neutrophils Absolute: 2.9 10*3/uL (ref 1.4–7.0)
Neutrophils: 52 %
Platelets: 213 10*3/uL (ref 150–450)
RBC: 5.4 x10E6/uL (ref 4.14–5.80)
RDW: 13.9 % (ref 11.6–15.4)
WBC: 5.6 10*3/uL (ref 3.4–10.8)

## 2021-11-14 ENCOUNTER — Encounter: Payer: Self-pay | Admitting: Family

## 2021-11-14 ENCOUNTER — Ambulatory Visit (INDEPENDENT_AMBULATORY_CARE_PROVIDER_SITE_OTHER): Payer: BC Managed Care – PPO | Admitting: Family

## 2021-11-14 VITALS — BP 119/75 | HR 82 | Temp 97.0°F | Ht 70.0 in | Wt 250.0 lb

## 2021-11-14 DIAGNOSIS — M7662 Achilles tendinitis, left leg: Secondary | ICD-10-CM

## 2021-11-14 MED ORDER — PREDNISONE 10 MG (21) PO TBPK: ORAL_TABLET | ORAL | 0 refills | Status: DC

## 2021-11-14 NOTE — Progress Notes (Signed)
   Subjective:    Patient ID: Benjamin Edwards, male    DOB: Jan 07, 1966, 56 y.o.   MRN: [id number]  Chief Complaint  Patient presents with   Foot Injury    Left heel 2 weeks ago injured it and felt a pop it does not hurt day. Comes and goes    PT presents to the office today with left achillis pain that started two weeks ago after stepping down a step and heard a "pop".  Foot Injury  The incident occurred more than 1 week ago. There was no injury mechanism. Pain location: left heel. The pain is at a severity of 0/10 (when it hurts 8). He reports no foreign bodies present. The symptoms are aggravated by movement and weight bearing.      Review of Systems  All other systems reviewed and are negative.      Objective:   Physical Exam Vitals reviewed.  Constitutional:      General: He is not in acute distress.    Appearance: He is well-developed.  HENT:     Head: Normocephalic.     Right Ear: Tympanic membrane normal.     Left Ear: Tympanic membrane normal.  Eyes:     General:        Right eye: No discharge.        Left eye: No discharge.     Pupils: Pupils are equal, round, and reactive to light.  Neck:     Thyroid: No thyromegaly.  Cardiovascular:     Rate and Rhythm: Normal rate and regular rhythm.     Heart sounds: Normal heart sounds. No murmur heard. Pulmonary:     Effort: Pulmonary effort is normal. No respiratory distress.     Breath sounds: Normal breath sounds. No wheezing.  Abdominal:     General: Bowel sounds are normal. There is no distension.     Palpations: Abdomen is soft.     Tenderness: There is no abdominal tenderness.  Musculoskeletal:        General: No tenderness. Normal range of motion.     Cervical back: Normal range of motion and neck supple.  Skin:    General: Skin is warm and dry.     Findings: No erythema or rash.  Neurological:     Mental Status: He is alert and oriented to person, place, and time.     Cranial Nerves: No cranial nerve  deficit.     Deep Tendon Reflexes: Reflexes are normal and symmetric.  Psychiatric:        Behavior: Behavior normal.        Thought Content: Thought content normal.        Judgment: Judgment normal.      BP 119/75   Pulse 82   Temp (!) 97 F (36.1 C) (Temporal)   Ht 5\' 10"  (1.778 m)   Wt 250 lb (113.4 kg)   SpO2 93%   BMI 35.87 kg/m       Assessment & Plan:  Trayce Maino comes in today with chief complaint of Foot Injury (Left heel 2 weeks ago injured it and felt a pop it does not hurt day. Comes and goes )   Diagnosis and orders addressed:  1. Achilles tendinitis of left lower extremity Rest Ice Continue diclofenac BID with food  Start prednisone  - predniSONE (STERAPRED UNI-PAK 21 TAB) 10 MG (21) TBPK tablet; Use as directed  Dispense: 21 tablet; Refill: 0    Marti Sleigh, [name]

## 2021-11-14 NOTE — Patient Instructions (Signed)
Achilles Tendinitis  Achilles tendinitis is inflammation of the tough, cord-like band that attaches the lower leg muscles to the heel bone (Achilles tendon). This is usually caused by overusing the tendon and the ankle joint. Achilles tendinitis usually gets better over time with treatment and caring for yourself at home. It can take weeks or months to heal completely. What are the causes? This condition may be caused by: A sudden increase in exercise or activity, such as running. Doing the same exercises or activities, such as jumping, over and over. Not warming up calf muscles before exercising. Exercising in shoes that are worn out or not made for exercise. Having arthritis or a bone growth (spur) on the back of the heel bone. This can rub against the tendon and hurt it. Age-related wear and tear. Tendons become less flexible with age and are more likely to be injured. What are the signs or symptoms? Common symptoms of this condition include: Pain in the Achilles tendon or in the back of the leg, just above the heel. The pain usually gets worse with exercise. Stiffness or soreness in the back of the leg, especially in the morning. Swelling of the skin over the Achilles tendon. Thickening of the tendon. Trouble standing on tiptoe. How is this diagnosed? This condition is diagnosed based on your symptoms and a physical exam. You may have tests, including: X-rays. MRI. How is this treated? The goal of treatment is to relieve symptoms and help your injury heal. Treatment may include: Decreasing or stopping activities that caused the tendinitis. This may mean switching to low-impact exercises like biking or swimming. Icing the injured area. Doing physical therapy, including strengthening and stretching exercises. Taking NSAIDs, such as ibuprofen, to help relieve pain and swelling. Using supportive shoes, wraps, heel lifts, or a walking boot (air cast). Having surgery. This may be done if  your symptoms do not improve after other treatments. Using high-energy shock wave impulses to stimulate the healing process (extracorporeal shock wave therapy). This is rare. Having an injection of medicines that help relieve inflammation (corticosteroids). This is rare. Follow these instructions at home: If you have an air cast: Wear the air cast as told by your health care provider. Remove it only as told by your health care provider. Loosen it if your toes tingle, become numb, or turn cold and blue. Keep it clean. If the air cast is not waterproof: Do not let it get wet. Cover it with a watertight covering when you take a bath or shower. Managing pain, stiffness, and swelling  If directed, put ice on the injured area. To do this: If you have a removable air cast, remove it as told by your health care provider. Put ice in a plastic bag. Place a towel between your skin and the bag. Leave the ice on for 20 minutes, 2-3 times a day. Move your toes often to reduce stiffness and swelling. Raise (elevate) your foot above the level of your heart while you are sitting or lying down. Activity Gradually return to your normal activities as told by your health care provider. Ask your health care provider what activities are safe for you. Do not do activities that cause pain. Consider doing low-impact exercises, like cycling or swimming. Ask your health care provider when it is safe to drive if you have an air cast on your foot. If physical therapy was prescribed, do exercises as told by your health care provider or physical therapist. General instructions If directed, wrap your   foot with an elastic bandage or other wrap. This can help to keep your tendon from moving too much while it heals. Your health care provider will show you how to wrap your foot correctly. Wear supportive shoes or heel lifts only as told by your health care provider. Take over-the-counter and prescription medicines only as  told by your health care provider. Keep all follow-up visits as told by your health care provider. This is important. Contact a health care provider if you: Have symptoms that get worse. Have pain that does not get better with medicine. Develop new, unexplained symptoms. Develop warmth and swelling in your foot. Have a fever. Get help right away if you: Have a sudden popping sound or sensation in your Achilles tendon followed by severe pain. Cannot move your toes or foot. Cannot put any weight on your foot. Your foot or toes become numb and look white or blue even after loosening your bandage or air cast. Summary Achilles tendinitis is inflammation of the tough, cord-like band that attaches the lower leg muscles to the heel bone (Achilles tendon). This condition is usually caused by overusing the tendon and the ankle joint. It can also be caused by arthritis or normal aging. The most common symptoms of this condition include pain, swelling, or stiffness in the Achilles tendon or in the back of the leg. This condition is usually treated by decreasing or stopping activities that caused the tendinitis, icing the injured area, taking NSAIDs, and doing physical therapy. This information is not intended to replace advice given to you by your health care provider. Make sure you discuss any questions you have with your health care provider. Document Revised: 08/09/2018 Document Reviewed: 08/09/2018 Elsevier Patient Education  2023 [employer]  

## 2021-12-19 ENCOUNTER — Other Ambulatory Visit: Payer: Self-pay | Admitting: Family

## 2021-12-19 DIAGNOSIS — I10 Essential (primary) hypertension: Secondary | ICD-10-CM

## 2022-02-04 ENCOUNTER — Other Ambulatory Visit: Payer: Self-pay | Admitting: Family

## 2022-02-04 DIAGNOSIS — M25511 Pain in right shoulder: Secondary | ICD-10-CM

## 2022-02-04 DIAGNOSIS — M1990 Unspecified osteoarthritis, unspecified site: Secondary | ICD-10-CM

## 2022-03-18 ENCOUNTER — Other Ambulatory Visit: Payer: Self-pay | Admitting: Family

## 2022-03-18 DIAGNOSIS — I10 Essential (primary) hypertension: Secondary | ICD-10-CM

## 2022-03-18 DIAGNOSIS — E785 Hyperlipidemia, unspecified: Secondary | ICD-10-CM

## 2022-05-22 ENCOUNTER — Encounter: Payer: Self-pay | Admitting: Family

## 2022-05-22 ENCOUNTER — Ambulatory Visit: Payer: BC Managed Care – PPO | Admitting: Family

## 2022-05-22 VITALS — BP 118/72 | HR 56 | Temp 97.7°F | Ht 70.0 in | Wt 251.2 lb

## 2022-05-22 DIAGNOSIS — F411 Generalized anxiety disorder: Secondary | ICD-10-CM

## 2022-05-22 DIAGNOSIS — M1990 Unspecified osteoarthritis, unspecified site: Secondary | ICD-10-CM

## 2022-05-22 DIAGNOSIS — I10 Essential (primary) hypertension: Secondary | ICD-10-CM

## 2022-05-22 DIAGNOSIS — Z23 Encounter for immunization: Secondary | ICD-10-CM | POA: Diagnosis not present

## 2022-05-22 DIAGNOSIS — K219 Gastro-esophageal reflux disease without esophagitis: Secondary | ICD-10-CM

## 2022-05-22 DIAGNOSIS — E785 Hyperlipidemia, unspecified: Secondary | ICD-10-CM

## 2022-05-22 DIAGNOSIS — Z6836 Body mass index (BMI) 36.0-36.9, adult: Secondary | ICD-10-CM

## 2022-05-22 NOTE — Progress Notes (Signed)
Subjective:    Patient ID: Benjamin Edwards, male    DOB: 1966/01/06, 57 y.o.   MRN: [id number]  Chief Complaint  Patient presents with   Medical Management of Chronic Issues   Pt presents to the office today for chronic follow up. Pt is morbid obese with a BMI of 36 and HTN and hyperlipidemia.    Hypertension This is a chronic problem. The current episode started more than 1 year ago. The problem has been resolved since onset. The problem is controlled. Associated symptoms include anxiety. Pertinent negatives include no malaise/fatigue, peripheral edema or shortness of breath. Risk factors for coronary artery disease include dyslipidemia and obesity. The current treatment provides moderate improvement.  Gastroesophageal Reflux He complains of belching and heartburn. This is a chronic problem. The current episode started more than 1 year ago. The problem occurs occasionally. Risk factors include obesity. He has tried a diet change for the symptoms. The treatment provided moderate relief.  Arthritis Presents for follow-up visit. He complains of pain and stiffness. Affected locations include the right shoulder and left shoulder. His pain is at a severity of 8/10.  Hyperlipidemia This is a chronic problem. The current episode started more than 1 year ago. Exacerbating diseases include obesity. Pertinent negatives include no shortness of breath. Current antihyperlipidemic treatment includes statins. The current treatment provides moderate improvement of lipids. Risk factors for coronary artery disease include dyslipidemia, hypertension, male sex and a sedentary lifestyle.  Anxiety Presents for follow-up visit. Symptoms include depressed mood, excessive worry, irritability and nervous/anxious behavior. Patient reports no shortness of breath. Symptoms occur rarely. The severity of symptoms is moderate.        Review of Systems  Constitutional:  Positive for irritability. Negative for  malaise/fatigue.  Respiratory:  Negative for shortness of breath.   Gastrointestinal:  Positive for heartburn.  Musculoskeletal:  Positive for arthritis and stiffness.  Psychiatric/Behavioral:  The patient is nervous/anxious.   All other systems reviewed and are negative.      Objective:   Physical Exam Vitals reviewed.  Constitutional:      General: He is not in acute distress.    Appearance: He is well-developed. He is obese.  HENT:     Head: Normocephalic.     Right Ear: Tympanic membrane normal.     Left Ear: Tympanic membrane normal.  Eyes:     General:        Right eye: No discharge.        Left eye: No discharge.     Pupils: Pupils are equal, round, and reactive to light.  Neck:     Thyroid: No thyromegaly.  Cardiovascular:     Rate and Rhythm: Normal rate and regular rhythm.     Heart sounds: Normal heart sounds. No murmur heard. Pulmonary:     Effort: Pulmonary effort is normal. No respiratory distress.     Breath sounds: Normal breath sounds. No wheezing.  Abdominal:     General: Bowel sounds are normal. There is no distension.     Palpations: Abdomen is soft.     Tenderness: There is no abdominal tenderness.  Musculoskeletal:        General: No tenderness. Normal range of motion.     Cervical back: Normal range of motion and neck supple.  Skin:    General: Skin is warm and dry.     Findings: No erythema or rash.  Neurological:     Mental Status: He is alert and oriented to person, place,  and time.     Cranial Nerves: No cranial nerve deficit.     Deep Tendon Reflexes: Reflexes are normal and symmetric.  Psychiatric:        Behavior: Behavior normal.        Thought Content: Thought content normal.        Judgment: Judgment normal.       BP 118/72   Pulse (!) 56   Temp 97.7 F (36.5 C) (Temporal)   Ht 5' 10"$  (1.778 m)   Wt 251 lb 3.2 oz (113.9 kg)   SpO2 95%   BMI 36.04 kg/m      Assessment & Plan:  Benjamin Edwards comes in today with chief  complaint of Medical Management of Chronic Issues   Diagnosis and orders addressed:  1. Essential hypertension - CMP14+EGFR  2. Arthritis - CMP14+EGFR  3. GAD (generalized anxiety disorder) - CMP14+EGFR  4. Gastroesophageal reflux disease, unspecified whether esophagitis present - CMP14+EGFR  5. Hyperlipidemia, unspecified hyperlipidemia type - CMP14+EGFR  6. Morbid obesity ([place]) - CMP14+EGFR   Labs pending Health Maintenance reviewed Diet and exercise encouraged  Follow up plan: 6 months    Evelina Dun, [name]

## 2022-05-22 NOTE — Patient Instructions (Signed)
Rotator Cuff Tendinitis  Rotator cuff tendinitis is inflammation of the tendons in the rotator cuff. Tendons are tough, cord-like bands that connect muscle to bone. The rotator cuff includes all of the muscles and tendons that connect the arm to the shoulder. The rotator cuff holds the head of the humerus, or the upper arm bone, in the cup of the shoulder blade (scapula). This condition can lead to a long-term or chronic tear. The tear may be partial or complete. What are the causes? This condition is usually caused by overusing the rotator cuff. What increases the risk? This condition is more likely to develop in athletes and workers who frequently use their shoulder or reach over their heads. This can include activities such as: Tennis. Baseball or softball. Swimming. Construction work. Painting. What are the signs or symptoms? Symptoms of this condition include: Pain that spreads (radiates) from the shoulder to the upper arm. Swelling and tenderness in front of the shoulder. Pain when reaching, pulling, or lifting the arm above the head. Pain when lowering the arm from above the head. Minor pain in the shoulder when resting. Increased pain in the shoulder at night. Difficulty placing the arm behind the back. How is this diagnosed? This condition is diagnosed with a physical exam and medical history. Tests may also be done, including: X-rays. MRI. Ultrasound. CT with or without contrast. How is this treated? Treatment for this condition depends on the severity of the condition. In less severe cases, treatment may include: Rest. This may be done with a sling that holds the shoulder still (immobilization). Your health care provider may also recommend avoiding activities that involve lifting your arm over your head. Icing the shoulder. Anti-inflammatory medicines, such as aspirin or ibuprofen. In more severe cases, treatment may include: Physical therapy. Steroid  injections. Surgery. Follow these instructions at home: If you have a sling: Wear the sling as told by your health care provider. Remove it only as told by your health care provider. Loosen it if your fingers tingle, become numb, or turn cold and blue. Keep it clean. If the sling is not waterproof: Do not let it get wet. Cover it with a watertight covering when you take a bath or shower. Managing pain, stiffness, and swelling  If directed, put ice on the injured area. To do this: If you have a removable sling, remove it as told by your health care provider. Put ice in a plastic bag. Place a towel between your skin and the bag. Leave the ice on for 20 minutes, 2-3 times a day. Move your fingers often to reduce stiffness and swelling. Raise (elevate) the injured area above the level of your heart while you are lying down. Find a comfortable sleeping position, or sleep in a recliner, if available. Activity Rest your shoulder as told by your health care provider. Ask your health care provider when it is safe to drive if you have a sling on your arm. Return to your normal activities as told by your health care provider. Ask your health care provider what activities are safe for you. Do any exercises or stretches as told by your health care provider or physical therapist. If you do repetitive overhead tasks, take small breaks in between and include stretching exercises as told by your health care provider. General instructions Do not use any products that contain nicotine or tobacco, such as cigarettes, e-cigarettes, and chewing tobacco. These can delay healing. If you need help quitting, ask your health care provider.  Take over-the-counter and prescription medicines only as told by your health care provider. Keep all follow-up visits as told by your health care provider. This is important. Contact a health care provider if: Your pain gets worse. You have new pain in your arm, hands, or  fingers. Your pain is not relieved with medicine or does not get better after 6 weeks of treatment. You have crackling sensations when moving your shoulder in certain directions. You hear a snapping sound after using your shoulder, followed by severe pain and weakness. Get help right away if: Your arm, hand, or fingers are numb or tingling. Your arm, hand, or fingers are swollen or painful or they turn white or blue. Summary Rotator cuff tendinitis is inflammation of the tendons in the rotator cuff. Tendons are tough, cord-like bands that connect muscle to bone. This condition is usually caused by overusing the rotator cuff, which includes all of the muscles and tendons that connect the arm to the shoulder. This condition is more likely to develop in athletes and workers who frequently use their shoulder or reach over their heads. Treatment generally includes rest, anti-inflammatory medicines, and icing. In some cases, physical therapy and steroid injections may be needed. In severe cases, surgery may be needed. This information is not intended to replace advice given to you by your health care provider. Make sure you discuss any questions you have with your health care provider. Document Revised: 12/27/2018 Document Reviewed: 12/27/2018 Elsevier Patient Education  [place].

## 2022-05-23 LAB — CMP14+EGFR
ALT: 38 IU/L (ref 0–44)
AST: 27 IU/L (ref 0–40)
Albumin/Globulin Ratio: 1.7 (ref 1.2–2.2)
Albumin: 4.5 g/dL (ref 3.8–4.9)
Alkaline Phosphatase: 65 IU/L (ref 44–121)
BUN/Creatinine Ratio: 12 (ref 9–20)
BUN: 13 mg/dL (ref 6–24)
Bilirubin Total: 0.5 mg/dL (ref 0.0–1.2)
CO2: 23 mmol/L (ref 20–29)
Calcium: 9.2 mg/dL (ref 8.7–10.2)
Chloride: 102 mmol/L (ref 96–106)
Creatinine, Ser: 1.09 mg/dL (ref 0.76–1.27)
Globulin, Total: 2.7 g/dL (ref 1.5–4.5)
Glucose: 90 mg/dL (ref 70–99)
Potassium: 4.1 mmol/L (ref 3.5–5.2)
Sodium: 141 mmol/L (ref 134–144)
Total Protein: 7.2 g/dL (ref 6.0–8.5)
eGFR: 79 mL/min/{1.73_m2} (ref >59)

## 2022-06-19 ENCOUNTER — Other Ambulatory Visit: Payer: Self-pay | Admitting: Family

## 2022-06-19 DIAGNOSIS — I10 Essential (primary) hypertension: Secondary | ICD-10-CM

## 2022-06-19 DIAGNOSIS — Z0000 Encounter for general adult medical examination without abnormal findings: Secondary | ICD-10-CM

## 2022-07-01 ENCOUNTER — Other Ambulatory Visit: Payer: Self-pay | Admitting: Family

## 2022-07-01 DIAGNOSIS — E785 Hyperlipidemia, unspecified: Secondary | ICD-10-CM

## 2022-07-01 DIAGNOSIS — I10 Essential (primary) hypertension: Secondary | ICD-10-CM

## 2022-08-02 ENCOUNTER — Other Ambulatory Visit: Payer: Self-pay | Admitting: Family

## 2022-08-02 DIAGNOSIS — M1990 Unspecified osteoarthritis, unspecified site: Secondary | ICD-10-CM

## 2022-08-02 DIAGNOSIS — M25511 Pain in right shoulder: Secondary | ICD-10-CM

## 2022-10-27 ENCOUNTER — Other Ambulatory Visit: Payer: Self-pay | Admitting: Family

## 2022-10-27 DIAGNOSIS — M25511 Pain in right shoulder: Secondary | ICD-10-CM

## 2022-10-27 DIAGNOSIS — M1990 Unspecified osteoarthritis, unspecified site: Secondary | ICD-10-CM

## 2022-11-21 ENCOUNTER — Ambulatory Visit: Payer: BC Managed Care – PPO | Admitting: Family

## 2022-11-21 ENCOUNTER — Encounter: Payer: Self-pay | Admitting: Family

## 2022-11-21 VITALS — BP 136/84 | HR 62 | Temp 97.9°F | Ht 70.0 in | Wt 255.2 lb

## 2022-11-21 DIAGNOSIS — I10 Essential (primary) hypertension: Secondary | ICD-10-CM | POA: Diagnosis not present

## 2022-11-21 DIAGNOSIS — F411 Generalized anxiety disorder: Secondary | ICD-10-CM | POA: Diagnosis not present

## 2022-11-21 DIAGNOSIS — Z0001 Encounter for general adult medical examination with abnormal findings: Secondary | ICD-10-CM

## 2022-11-21 DIAGNOSIS — H348112 Central retinal vein occlusion, right eye, stable: Secondary | ICD-10-CM

## 2022-11-21 DIAGNOSIS — M1990 Unspecified osteoarthritis, unspecified site: Secondary | ICD-10-CM | POA: Diagnosis not present

## 2022-11-21 DIAGNOSIS — K219 Gastro-esophageal reflux disease without esophagitis: Secondary | ICD-10-CM

## 2022-11-21 DIAGNOSIS — E785 Hyperlipidemia, unspecified: Secondary | ICD-10-CM

## 2022-11-21 DIAGNOSIS — Z0000 Encounter for general adult medical examination without abnormal findings: Secondary | ICD-10-CM

## 2022-11-21 MED ORDER — ALPRAZOLAM 0.25 MG PO TABS: 0.2500 mg | ORAL_TABLET | Freq: Two times a day (BID) | ORAL | 0 refills | Status: DC | PRN

## 2022-11-21 MED ORDER — ESCITALOPRAM OXALATE 10 MG PO TABS: 10.0000 mg | ORAL_TABLET | Freq: Every day | ORAL | 3 refills | Status: DC

## 2022-11-21 NOTE — Progress Notes (Signed)
Subjective:    Patient ID: Benjamin Edwards, male    DOB: 06/27/1965, 57 y.o.   MRN: [id number]  Chief Complaint  Patient presents with   Medical Management of Chronic Issues   Pt presents to the office today for CPE and chronic follow up. Pt is morbid obese with a BMI of 36 and HTN and hyperlipidemia.    He has CRVO and getting monthly injections in his retina. This is causing a lot of anxiety for him as he can not put eye drops in his eye.  Hypertension This is a chronic problem. The current episode started more than 1 year ago. The problem has been resolved since onset. The problem is controlled. Associated symptoms include anxiety. Pertinent negatives include no malaise/fatigue, peripheral edema or shortness of breath. Risk factors for coronary artery disease include dyslipidemia, obesity, male gender and sedentary lifestyle. The current treatment provides moderate improvement.  Gastroesophageal Reflux He complains of belching and heartburn. This is a chronic problem. The current episode started more than 1 year ago. The problem occurs occasionally. Risk factors include obesity. He has tried a PPI for the symptoms. The treatment provided moderate relief.  Arthritis Presents for follow-up visit. He complains of pain and stiffness. Affected locations include the left knee, right knee, left MCP, right MCP, left shoulder and right shoulder. His pain is at a severity of 8/10.  Hyperlipidemia This is a chronic problem. The current episode started more than 1 year ago. The problem is controlled. Recent lipid tests were reviewed and are normal. Exacerbating diseases include obesity. Pertinent negatives include no shortness of breath. Current antihyperlipidemic treatment includes statins. The current treatment provides moderate improvement of lipids. Risk factors for coronary artery disease include dyslipidemia, hypertension, male sex and a sedentary lifestyle.  Anxiety Presents for follow-up visit.  Symptoms include depressed mood, excessive worry, irritability and nervous/anxious behavior. Patient reports no shortness of breath. Symptoms occur most days. The severity of symptoms is moderate.        Review of Systems  Constitutional:  Positive for irritability. Negative for malaise/fatigue.  Respiratory:  Negative for shortness of breath.   Gastrointestinal:  Positive for heartburn.  Musculoskeletal:  Positive for arthritis and stiffness.  Psychiatric/Behavioral:  The patient is nervous/anxious.   All other systems reviewed and are negative.   Family History  Problem Relation Age of Onset   Heart disease Father    Hyperlipidemia Father    Hypertension Father    Pancreatic cancer Father        started in pancreas, mets to stomach    Colon cancer Other 92   Esophageal cancer Paternal Aunt    Colon polyps Neg Hx    Rectal cancer Neg Hx    Stomach cancer Neg Hx    Social History   Socioeconomic History   Marital status: Married    Spouse name: Not on file   Number of children: Not on file   Years of education: Not on file   Highest education level: Not on file  Occupational History   Not on file  Tobacco Use   Smoking status: Never   Smokeless tobacco: Former    Types: Snuff    Quit date: 04/07/2021  Vaping Use   Vaping status: Never Used  Substance and Sexual Activity   Alcohol use: Not Currently   Drug use: No   Sexual activity: Yes  Other Topics Concern   Not on file  Social History Narrative   Not on file  Social Determinants of Health   Financial Resource Strain: Not on file  Food Insecurity: Not on file  Transportation Needs: Not on file  Physical Activity: Not on file  Stress: Not on file  Social Connections: Not on file       Objective:   Physical Exam Vitals reviewed.  Constitutional:      General: He is not in acute distress.    Appearance: He is well-developed. He is obese.  HENT:     Head: Normocephalic.     Right Ear: Tympanic  membrane normal.     Left Ear: Tympanic membrane normal.  Eyes:     General:        Right eye: No discharge.        Left eye: No discharge.     Pupils: Pupils are equal, round, and reactive to light.  Neck:     Thyroid: No thyromegaly.  Cardiovascular:     Rate and Rhythm: Normal rate and regular rhythm.     Heart sounds: Normal heart sounds. No murmur heard. Pulmonary:     Effort: Pulmonary effort is normal. No respiratory distress.     Breath sounds: Normal breath sounds. No wheezing.  Abdominal:     General: Bowel sounds are normal. There is no distension.     Palpations: Abdomen is soft.     Tenderness: There is no abdominal tenderness.  Musculoskeletal:        General: No tenderness. Normal range of motion.     Cervical back: Normal range of motion and neck supple.  Skin:    General: Skin is warm and dry.     Findings: No erythema or rash.  Neurological:     Mental Status: He is alert and oriented to person, place, and time.     Cranial Nerves: No cranial nerve deficit.     Deep Tendon Reflexes: Reflexes are normal and symmetric.  Psychiatric:        Behavior: Behavior normal.        Thought Content: Thought content normal.        Judgment: Judgment normal.      BP 136/84   Pulse 62   Temp 97.9 F (36.6 C) (Temporal)   Ht 5\' 10"  (1.778 m)   Wt 255 lb 3.2 oz (115.8 kg)   SpO2 94%   BMI 36.62 kg/m      Assessment & Plan:  Benjamin Edwards comes in today with chief complaint of Medical Management of Chronic Issues   Diagnosis and orders addressed:  1. Annual physical exam - CMP14+EGFR - CBC with Differential/Platelet - Lipid panel - TSH - PSA, total and free  2. Arthritis - CMP14+EGFR - CBC with Differential/Platelet  3. Essential hypertension - CMP14+EGFR - CBC with Differential/Platelet  4. GAD (generalized anxiety disorder - ALPRAZolam (XANAX) 0.25 MG tablet; Take 1 tablet (0.25 mg total) by mouth 2 (two) times daily as needed for anxiety.   Dispense: 20 tablet; Refill: 0 - CMP14+EGFR - CBC with Differential/Platelet  5. Gastroesophageal reflux disease, unspecified whether esophagitis present - CMP14+EGFR - CBC with Differential/Platelet  6. Hyperlipidemia, unspecified hyperlipidemia type - CMP14+EGFR - CBC with Differential/Platelet - Lipid panel  7. Morbid obesity ([facility]) - CMP14+EGFR - CBC with Differential/Platelet  8. Central retinal vein occlusion of right eye, unspecified complication status - ALPRAZolam (XANAX) 0.25 MG tablet; Take 1 tablet (0.25 mg total) by mouth 2 (two) times daily as needed for anxiety.  Dispense: 20 tablet; Refill: 0 - CMP14+EGFR -  CBC with Differential/Platelet   Labs pending Start xaxan 0.25 mg before injection of retina  Will start Lexapro 10 mg today Stress management  Health Maintenance reviewed Diet and exercise encouraged  Follow up plan: 4-6 weeks to recheck GAD   Jannifer Rodney, [name]

## 2022-11-21 NOTE — Patient Instructions (Signed)

## 2022-11-22 LAB — LIPID PANEL
Chol/HDL Ratio: 6.1 ratio — ABNORMAL HIGH (ref 0.0–5.0)
Cholesterol, Total: 194 mg/dL (ref 100–199)
HDL: 32 mg/dL — ABNORMAL LOW (ref >39)
LDL Chol Calc (NIH): 85 mg/dL (ref 0–99)
Triglycerides: 477 mg/dL — ABNORMAL HIGH (ref 0–149)
VLDL Cholesterol Cal: 77 mg/dL — ABNORMAL HIGH (ref 5–40)

## 2022-11-22 LAB — CBC WITH DIFFERENTIAL/PLATELET
Basophils Absolute: 0 10*3/uL (ref 0.0–0.2)
Basos: 1 %
EOS (ABSOLUTE): 0.2 10*3/uL (ref 0.0–0.4)
Eos: 4 %
Hematocrit: 44.2 % (ref 37.5–51.0)
Hemoglobin: 14.9 g/dL (ref 13.0–17.7)
Immature Grans (Abs): 0 10*3/uL (ref 0.0–0.1)
Immature Granulocytes: 0 %
Lymphocytes Absolute: 1.4 10*3/uL (ref 0.7–3.1)
Lymphs: 31 %
MCH: 28.5 pg (ref 26.6–33.0)
MCHC: 33.7 g/dL (ref 31.5–35.7)
MCV: 85 fL (ref 79–97)
Monocytes Absolute: 0.5 10*3/uL (ref 0.1–0.9)
Monocytes: 11 %
Neutrophils Absolute: 2.4 10*3/uL (ref 1.4–7.0)
Neutrophils: 53 %
Platelets: 187 10*3/uL (ref 150–450)
RBC: 5.23 x10E6/uL (ref 4.14–5.80)
RDW: 13.7 % (ref 11.6–15.4)
WBC: 4.4 10*3/uL (ref 3.4–10.8)

## 2022-11-22 LAB — CMP14+EGFR
ALT: 35 IU/L (ref 0–44)
AST: 25 IU/L (ref 0–40)
Albumin: 4.6 g/dL (ref 3.8–4.9)
Alkaline Phosphatase: 68 IU/L (ref 44–121)
BUN/Creatinine Ratio: 10 (ref 9–20)
BUN: 9 mg/dL (ref 6–24)
Bilirubin Total: 0.4 mg/dL (ref 0.0–1.2)
CO2: 25 mmol/L (ref 20–29)
Calcium: 9.4 mg/dL (ref 8.7–10.2)
Chloride: 103 mmol/L (ref 96–106)
Creatinine, Ser: 0.92 mg/dL (ref 0.76–1.27)
Globulin, Total: 2.4 g/dL (ref 1.5–4.5)
Glucose: 136 mg/dL — ABNORMAL HIGH (ref 70–99)
Potassium: 3.9 mmol/L (ref 3.5–5.2)
Sodium: 141 mmol/L (ref 134–144)
Total Protein: 7 g/dL (ref 6.0–8.5)
eGFR: 96 mL/min/{1.73_m2} (ref >59)

## 2022-11-22 LAB — PSA, TOTAL AND FREE
PSA, Free Pct: 38.3 %
PSA, Free: 0.23 ng/mL
Prostate Specific Ag, Serum: 0.6 ng/mL (ref 0.0–4.0)

## 2022-11-22 LAB — TSH: TSH: 1.14 u[IU]/mL (ref 0.450–4.500)

## 2022-12-06 ENCOUNTER — Other Ambulatory Visit: Payer: Self-pay | Admitting: Family

## 2022-12-06 DIAGNOSIS — M25511 Pain in right shoulder: Secondary | ICD-10-CM

## 2022-12-06 DIAGNOSIS — M1990 Unspecified osteoarthritis, unspecified site: Secondary | ICD-10-CM

## 2022-12-26 ENCOUNTER — Other Ambulatory Visit: Payer: Self-pay | Admitting: Family

## 2022-12-26 DIAGNOSIS — Z0000 Encounter for general adult medical examination without abnormal findings: Secondary | ICD-10-CM

## 2022-12-26 DIAGNOSIS — I10 Essential (primary) hypertension: Secondary | ICD-10-CM

## 2022-12-26 DIAGNOSIS — E785 Hyperlipidemia, unspecified: Secondary | ICD-10-CM

## 2023-01-02 ENCOUNTER — Ambulatory Visit: Payer: BC Managed Care – PPO | Admitting: Family

## 2023-01-30 ENCOUNTER — Ambulatory Visit: Payer: BC Managed Care – PPO | Admitting: Family

## 2023-01-30 ENCOUNTER — Encounter: Payer: Self-pay | Admitting: Family

## 2023-01-30 VITALS — BP 129/83 | HR 62 | Temp 97.7°F | Ht 70.0 in | Wt 251.6 lb

## 2023-01-30 DIAGNOSIS — H348112 Central retinal vein occlusion, right eye, stable: Secondary | ICD-10-CM

## 2023-01-30 DIAGNOSIS — K219 Gastro-esophageal reflux disease without esophagitis: Secondary | ICD-10-CM | POA: Diagnosis not present

## 2023-01-30 DIAGNOSIS — M7581 Other shoulder lesions, right shoulder: Secondary | ICD-10-CM | POA: Diagnosis not present

## 2023-01-30 DIAGNOSIS — E785 Hyperlipidemia, unspecified: Secondary | ICD-10-CM

## 2023-01-30 DIAGNOSIS — Z23 Encounter for immunization: Secondary | ICD-10-CM | POA: Diagnosis not present

## 2023-01-30 DIAGNOSIS — F411 Generalized anxiety disorder: Secondary | ICD-10-CM

## 2023-01-30 DIAGNOSIS — I10 Essential (primary) hypertension: Secondary | ICD-10-CM

## 2023-01-30 DIAGNOSIS — M1990 Unspecified osteoarthritis, unspecified site: Secondary | ICD-10-CM

## 2023-01-30 MED ORDER — LIDOCAINE-EPINEPHRINE 1 %-1:100000 IJ SOLN
1.0000 mL | Freq: Once | INTRAMUSCULAR | Status: AC
  Administered 2023-01-30: 1 mL via INTRADERMAL

## 2023-01-30 MED ORDER — METHYLPREDNISOLONE ACETATE 40 MG/ML IJ SUSP
40.0000 mg | Freq: Once | INTRAMUSCULAR | Status: AC
  Administered 2023-01-30: 40 mg via INTRAMUSCULAR

## 2023-01-30 MED ORDER — ESCITALOPRAM OXALATE 20 MG PO TABS: 20.0000 mg | ORAL_TABLET | Freq: Every day | ORAL | 1 refills | Status: DC

## 2023-01-30 NOTE — Patient Instructions (Signed)
Rotator Cuff Tendinitis  Rotator cuff tendinitis is inflammation of the tendons in the rotator cuff. Tendons are tough, cord-like bands that connect muscle to bone. The rotator cuff includes all of the muscles and tendons that connect the arm to the shoulder. The rotator cuff holds the head of the humerus, or the upper arm bone, in the cup of the shoulder blade (scapula). This condition can lead to a long-term (chronic) tear. The tear may be partial or complete. What are the causes? This condition is usually caused by overusing the rotator cuff. What increases the risk? This condition is more likely to develop in athletes and workers who frequently use their shoulder or reach over their heads. This can include activities such as: Tennis. Baseball or softball. Swimming. Construction work. Painting. What are the signs or symptoms? Symptoms of this condition include: Pain that spreads (radiates) from the shoulder to the upper arm. Swelling and tenderness in front of the shoulder. Pain when reaching, pulling, or lifting the arm above the head. Pain when lowering the arm from above the head. Minor pain in the shoulder when resting. Increased pain in the shoulder at night. Difficulty placing the arm behind the back. How is this diagnosed? This condition is diagnosed with a physical exam and medical history. Tests may also be done, including: X-rays. CT. MRI. Ultrasound. How is this treated? Treatment depends on the severity of the condition. In less severe cases, treatment may include: Rest. This may be done with a sling that holds the shoulder still (immobilization). Your health care provider may also recommend avoiding activities that involve lifting your arm over your head. Icing the shoulder. Anti-inflammatory medicines, such as aspirin or ibuprofen. In more severe cases, treatment may include: Physical therapy. Steroid injections. Surgery. Follow these instructions at home: If  you have a removable sling: Wear the sling as told by your provider. Remove it only as told by your provider. Check the skin around the sling every day. Tell your provider about any concerns. Loosen the sling if your fingers tingle, become numb, or turn cold or blue. Keep the sling clean and dry. If the sling is not waterproof: Do not let it get wet. Remove it as told by your provider when you take a bath or shower. Managing pain, stiffness, and swelling  If told, put ice on the injured area. If you have a removable sling, remove it as told by your provider. Put ice in a plastic bag. Place a towel between your skin and the bag. Leave the ice on for 20 minutes, 2-3 times a day. If your skin turns bright red, remove the ice right away to prevent skin damage. The risk of damage is higher if you cannot feel pain, heat, or cold. Move your fingers often to reduce stiffness and swelling. Raise (elevate) the injured area above the level of your heart while you are sitting or lying down. Find a comfortable sleeping position, or sleep in a recliner, if available. Activity Rest your shoulder as told by your provider. Ask your provider when it is safe to drive if you have a sling on your arm. Return to your normal activities as told by your provider. Ask your provider what activities are safe for you. Do any exercises or stretches as told by your provider or physical therapist. If you do repetitive overhead tasks, take small breaks in between and include stretching exercises as told by your provider. General instructions Do not use any products that contain nicotine or   tobacco. These products include cigarettes, chewing tobacco, and vaping devices, such as e-cigarettes. These can delay healing. If you need help quitting, ask your provider. Take over-the-counter and prescription medicines only as told by your provider. Contact a health care provider if: Your pain gets worse. You have new pain in  your arm, hands, or fingers. Your pain is not relieved with medicine or does not get better after 6 weeks of treatment. You have crackling sensations when moving your shoulder in certain directions. You hear a snapping sound after using your shoulder, followed by severe pain and weakness. Your arm, hand, or fingers are numb or tingling. Get help right away if: Your arm, hand, or fingers are swollen, painful, or they turn white or blue. This information is not intended to replace advice given to you by your health care provider. Make sure you discuss any questions you have with your health care provider. Document Revised: 11/20/2021 Document Reviewed: 11/06/2021 Elsevier Patient Education  2024 [employer].

## 2023-01-30 NOTE — Progress Notes (Signed)
Subjective:    Patient ID: Benjamin Edwards, male    DOB: [birth year], 57 y.o.   MRN: [id number]  Chief Complaint  Patient presents with   Medical Management of Chronic Issues   Shoulder Pain    Right    Pt presents to the office today for chronic follow up. Pt is morbid obese with a BMI of 36 and HTN and hyperlipidemia.     He has CRVO and getting monthly injections in his retina. This is causing a lot of anxiety for him as he can not put eye drops in his eye. Reports the xanax as needed has greatly helped.  Shoulder Pain  The pain is present in the right shoulder. This is a new problem. There has been no history of extremity trauma. The problem occurs intermittently. The pain is at a severity of 8/10. The pain is mild. Associated symptoms include stiffness. He has tried rest and acetaminophen for the symptoms. The treatment provided mild relief.  Hypertension This is a chronic problem. The current episode started in the past 7 days. The problem has been resolved since onset. The problem is controlled. Associated symptoms include anxiety and malaise/fatigue. Pertinent negatives include no peripheral edema or shortness of breath. Risk factors for coronary artery disease include male gender and obesity. The current treatment provides moderate improvement.  Gastroesophageal Reflux He complains of belching and heartburn. This is a chronic problem. The current episode started more than 1 year ago. The problem occurs occasionally. Risk factors include obesity. He has tried a PPI for the symptoms. The treatment provided moderate relief.  Arthritis Presents for follow-up visit. He complains of pain and stiffness. Affected locations include the right shoulder.  Hyperlipidemia This is a chronic problem. The current episode started more than 1 year ago. The problem is controlled. Recent lipid tests were reviewed and are normal. Exacerbating diseases include obesity. Pertinent negatives include no  shortness of breath. Current antihyperlipidemic treatment includes statins. The current treatment provides moderate improvement of lipids. Risk factors for coronary artery disease include dyslipidemia, hypertension and a sedentary lifestyle.  Anxiety Presents for follow-up visit. Symptoms include excessive worry, nervous/anxious behavior and restlessness. Patient reports no shortness of breath. Symptoms occur occasionally. The severity of symptoms is mild.        Review of Systems  Constitutional:  Positive for malaise/fatigue.  Respiratory:  Negative for shortness of breath.   Gastrointestinal:  Positive for heartburn.  Musculoskeletal:  Positive for arthritis and stiffness.  Psychiatric/Behavioral:  The patient is nervous/anxious.   All other systems reviewed and are negative.      Objective:   Physical Exam Vitals reviewed.  Constitutional:      General: He is not in acute distress.    Appearance: He is well-developed. He is obese.  HENT:     Head: Normocephalic.     Right Ear: Tympanic membrane normal.     Left Ear: Tympanic membrane normal.  Eyes:     General:        Right eye: No discharge.        Left eye: No discharge.     Pupils: Pupils are equal, round, and reactive to light.  Neck:     Thyroid: No thyromegaly.  Cardiovascular:     Rate and Rhythm: Normal rate and regular rhythm.     Heart sounds: Normal heart sounds. No murmur heard. Pulmonary:     Effort: Pulmonary effort is normal. No respiratory distress.     Breath sounds: Normal  breath sounds. No wheezing.  Abdominal:     General: Bowel sounds are normal. There is no distension.     Palpations: Abdomen is soft.     Tenderness: There is no abdominal tenderness.  Musculoskeletal:        General: No tenderness. Normal range of motion.     Cervical back: Normal range of motion and neck supple.     Comments: Full ROM of right shoulder pain   Skin:    General: Skin is warm and dry.     Findings: No  erythema or rash.  Neurological:     Mental Status: He is alert and oriented to person, place, and time.     Cranial Nerves: No cranial nerve deficit.     Deep Tendon Reflexes: Reflexes are normal and symmetric.  Psychiatric:        Behavior: Behavior normal.        Thought Content: Thought content normal.        Judgment: Judgment normal.    rightshoulder prepped with betadine Injected with lidocaine  .1% plain and methylprednisolone with 22 guage needle x 1. Patient tolerated well.   BP 129/83   Pulse 62   Temp 97.7 F (36.5 C) (Temporal)   Ht 5\' 10"  (1.778 m)   Wt 251 lb 9.6 oz (114.1 kg)   SpO2 94%   BMI 36.10 kg/m      Assessment & Plan:  Benjamin Edwards comes in today with chief complaint of Medical Management of Chronic Issues and Shoulder Pain (Right )   Diagnosis and orders addressed:  1. Encounter for immunization - Flu vaccine trivalent PF, 6mos and older(Flulaval,Afluria,Fluarix,Fluzone)  2. GAD (generalized anxiety disorder) Will increase Lexapro 20 mg from 10 mg  Stress management  - escitalopram (LEXAPRO) 20 MG tablet; Take 1 tablet (20 mg total) by mouth daily.  Dispense: 90 tablet; Refill: 1  3. Gastroesophageal reflux disease, unspecified whether esophagitis present  4. Hyperlipidemia, unspecified hyperlipidemia type  5. Morbid obesity (HCC)  6. Essential hypertension  7. Central retinal vein occlusion of right eye, unspecified complication status  8. Arthritis  9. Tendinitis of right rotator cuff - methylPREDNISolone acetate (DEPO-MEDROL) injection 40 mg - lidocaine-EPINEPHrine (XYLOCAINE W/EPI) 1 %-1:100000 (with pres) injection 1 mL   Continue current medications  Health Maintenance reviewed Diet and exercise encouraged  Follow up plan: 3 months   Jannifer Rodney, [name]

## 2023-03-23 ENCOUNTER — Other Ambulatory Visit: Payer: Self-pay | Admitting: Family

## 2023-03-23 DIAGNOSIS — I10 Essential (primary) hypertension: Secondary | ICD-10-CM

## 2023-03-27 ENCOUNTER — Other Ambulatory Visit: Payer: Self-pay | Admitting: Family

## 2023-03-27 DIAGNOSIS — E785 Hyperlipidemia, unspecified: Secondary | ICD-10-CM

## 2023-03-27 DIAGNOSIS — I10 Essential (primary) hypertension: Secondary | ICD-10-CM

## 2023-06-21 ENCOUNTER — Other Ambulatory Visit: Payer: Self-pay | Admitting: Family

## 2023-06-21 DIAGNOSIS — I10 Essential (primary) hypertension: Secondary | ICD-10-CM

## 2023-06-21 DIAGNOSIS — E785 Hyperlipidemia, unspecified: Secondary | ICD-10-CM

## 2023-06-24 ENCOUNTER — Other Ambulatory Visit: Payer: Self-pay | Admitting: Family

## 2023-06-24 DIAGNOSIS — F411 Generalized anxiety disorder: Secondary | ICD-10-CM

## 2023-06-29 ENCOUNTER — Encounter: Payer: Self-pay | Admitting: Family

## 2023-06-29 ENCOUNTER — Ambulatory Visit: Admitting: Family

## 2023-06-29 VITALS — BP 131/81 | HR 56 | Temp 97.6°F | Ht 70.0 in | Wt 247.2 lb

## 2023-06-29 DIAGNOSIS — M1990 Unspecified osteoarthritis, unspecified site: Secondary | ICD-10-CM | POA: Diagnosis not present

## 2023-06-29 DIAGNOSIS — I10 Essential (primary) hypertension: Secondary | ICD-10-CM | POA: Diagnosis not present

## 2023-06-29 DIAGNOSIS — M25511 Pain in right shoulder: Secondary | ICD-10-CM | POA: Diagnosis not present

## 2023-06-29 DIAGNOSIS — F411 Generalized anxiety disorder: Secondary | ICD-10-CM

## 2023-06-29 DIAGNOSIS — E785 Hyperlipidemia, unspecified: Secondary | ICD-10-CM

## 2023-06-29 DIAGNOSIS — G8929 Other chronic pain: Secondary | ICD-10-CM | POA: Diagnosis not present

## 2023-06-29 DIAGNOSIS — H348112 Central retinal vein occlusion, right eye, stable: Secondary | ICD-10-CM | POA: Diagnosis not present

## 2023-06-29 DIAGNOSIS — K219 Gastro-esophageal reflux disease without esophagitis: Secondary | ICD-10-CM

## 2023-06-29 MED ORDER — METOPROLOL SUCCINATE ER 200 MG PO TB24: 200.0000 mg | ORAL_TABLET | Freq: Every day | ORAL | 0 refills | Status: DC

## 2023-06-29 MED ORDER — BUPIVACAINE HCL 0.25 % IJ SOLN
1.0000 mL | Freq: Once | INTRAMUSCULAR | Status: AC
  Administered 2023-06-29: 1 mL via INTRA_ARTICULAR

## 2023-06-29 MED ORDER — ATORVASTATIN CALCIUM 20 MG PO TABS: 20.0000 mg | ORAL_TABLET | Freq: Every day | ORAL | 0 refills | Status: DC

## 2023-06-29 MED ORDER — ESCITALOPRAM OXALATE 20 MG PO TABS: 20.0000 mg | ORAL_TABLET | Freq: Every day | ORAL | 0 refills | Status: DC

## 2023-06-29 MED ORDER — AMLODIPINE BESYLATE-VALSARTAN 10-320 MG PO TABS: 1.0000 | ORAL_TABLET | Freq: Every day | ORAL | 0 refills | Status: DC

## 2023-06-29 MED ORDER — ALPRAZOLAM 0.25 MG PO TABS: 0.2500 mg | ORAL_TABLET | Freq: Two times a day (BID) | ORAL | 1 refills | Status: DC | PRN

## 2023-06-29 MED ORDER — METHYLPREDNISOLONE ACETATE 80 MG/ML IJ SUSP
80.0000 mg | Freq: Once | INTRAMUSCULAR | Status: AC
  Administered 2023-06-29: 80 mg via INTRA_ARTICULAR

## 2023-06-29 NOTE — Progress Notes (Signed)
 Subjective:    Patient ID: Benjamin Edwards, male    DOB: 09/17/1965, 58 y.o.   MRN: [id number]  Chief Complaint  Patient presents with   Medical Management of Chronic Issues   Shoulder Pain    Right shoulder been seen before for it    Pt presents to the office today for chronic follow up. Pt is morbid obese with a BMI of 35 and HTN and hyperlipidemia.     He has CRVO and getting monthly injections in his retina. This is causing a lot of anxiety for him as he can not put eye drops in his eye. Reports the xanax as needed has greatly helped.  Shoulder Pain  The pain is present in the right shoulder. This is a chronic problem. The current episode started more than 1 year ago. There has been no history of extremity trauma. The problem occurs intermittently. The quality of the pain is described as aching. The pain is at a severity of 8/10. The pain is mild. Associated symptoms include a limited range of motion and stiffness. He has tried rest and acetaminophen for the symptoms. The treatment provided mild relief.  Hypertension This is a chronic problem. The current episode started in the past 7 days. The problem has been resolved since onset. The problem is controlled. Associated symptoms include anxiety. Pertinent negatives include no malaise/fatigue, peripheral edema or shortness of breath. Risk factors for coronary artery disease include male gender and obesity. The current treatment provides moderate improvement.  Gastroesophageal Reflux He complains of belching and heartburn. This is a chronic problem. The current episode started more than 1 year ago. The problem occurs occasionally. Risk factors include obesity. He has tried a PPI for the symptoms. The treatment provided moderate relief.  Arthritis Presents for follow-up visit. He complains of pain and stiffness. Affected locations include the right shoulder, right knee and left knee. His pain is at a severity of 8/10.  Hyperlipidemia This is  a chronic problem. The current episode started more than 1 year ago. The problem is uncontrolled. Recent lipid tests were reviewed and are normal. Exacerbating diseases include obesity. Pertinent negatives include no shortness of breath. Current antihyperlipidemic treatment includes statins. The current treatment provides moderate improvement of lipids. Risk factors for coronary artery disease include dyslipidemia, hypertension and a sedentary lifestyle.  Anxiety Presents for follow-up visit. Symptoms include excessive worry, nervous/anxious behavior and restlessness. Patient reports no shortness of breath. Symptoms occur occasionally. The severity of symptoms is mild.        Review of Systems  Constitutional:  Negative for malaise/fatigue.  Respiratory:  Negative for shortness of breath.   Gastrointestinal:  Positive for heartburn.  Musculoskeletal:  Positive for stiffness.  Psychiatric/Behavioral:  The patient is nervous/anxious.   All other systems reviewed and are negative.      Objective:   Physical Exam Vitals reviewed.  Constitutional:      General: He is not in acute distress.    Appearance: He is well-developed. He is obese.  HENT:     Head: Normocephalic.     Right Ear: Tympanic membrane normal.     Left Ear: Tympanic membrane normal.  Eyes:     General:        Right eye: No discharge.        Left eye: No discharge.     Pupils: Pupils are equal, round, and reactive to light.  Neck:     Thyroid: No thyromegaly.  Cardiovascular:  Rate and Rhythm: Normal rate and regular rhythm.     Heart sounds: Normal heart sounds. No murmur heard. Pulmonary:     Effort: Pulmonary effort is normal. No respiratory distress.     Breath sounds: Normal breath sounds. No wheezing.  Abdominal:     General: Bowel sounds are normal. There is no distension.     Palpations: Abdomen is soft.     Tenderness: There is no abdominal tenderness.  Musculoskeletal:        General: No  tenderness.     Cervical back: Normal range of motion and neck supple.     Comments: Full ROM of right shoulder pain, pain with abduction   Skin:    General: Skin is warm and dry.     Findings: No erythema or rash.  Neurological:     Mental Status: He is alert and oriented to person, place, and time.     Cranial Nerves: No cranial nerve deficit.     Deep Tendon Reflexes: Reflexes are normal and symmetric.  Psychiatric:        Behavior: Behavior normal.        Thought Content: Thought content normal.        Judgment: Judgment normal.    rightshoulder prepped with betadine Injected with marcaine  .25% plain and methylprednisolone with 22 guage needle x 1. Patient tolerated well.   BP 131/81   Pulse (!) 56   Temp 97.6 F (36.4 C) (Temporal)   Ht 5\' 10"  (1.778 m)   Wt 247 lb 3.2 oz (112.1 kg)   SpO2 94%   BMI 35.47 kg/m      Assessment & Plan:  Benjamin Edwards comes in today with chief complaint of Medical Management of Chronic Issues and Shoulder Pain (Right shoulder been seen before for it )   Diagnosis and orders addressed:  1. GAD (generalized anxiety disorder) - ALPRAZolam (XANAX) 0.25 MG tablet; Take 1 tablet (0.25 mg total) by mouth 2 (two) times daily as needed for anxiety.  Dispense: 20 tablet; Refill: 1 - escitalopram (LEXAPRO) 20 MG tablet; Take 1 tablet (20 mg total) by mouth daily.  Dispense: 90 tablet; Refill: 0 - ToxASSURE Select 13 (MW), Urine  2. Central retinal vein occlusion of right eye, unspecified complication status - ALPRAZolam (XANAX) 0.25 MG tablet; Take 1 tablet (0.25 mg total) by mouth 2 (two) times daily as needed for anxiety.  Dispense: 20 tablet; Refill: 1  3. Essential hypertension - amLODipine-valsartan (EXFORGE) 10-320 MG tablet; Take 1 tablet by mouth daily.  Dispense: 90 tablet; Refill: 0 - metoprolol (TOPROL-XL) 200 MG 24 hr tablet; Take 1 tablet (200 mg total) by mouth daily.  Dispense: 90 tablet; Refill: 0  4. Hyperlipidemia,  unspecified hyperlipidemia type - atorvastatin (LIPITOR) 20 MG tablet; Take 1 tablet (20 mg total) by mouth daily.  Dispense: 90 tablet; Refill: 0  5. Arthritis (Primary) - Ambulatory referral to Orthopedic Surgery  6. Gastroesophageal reflux disease, unspecified whether esophagitis present  7. Morbid obesity (HCC)  8. Chronic right shoulder pain - Ambulatory referral to Orthopedic Surgery - bupivacaine (MARCAINE) 0.25 % (with pres) injection 1 mL - methylPREDNISolone acetate (DEPO-MEDROL) injection 80 mg   Continue current medications  Shoulder injection given today. Referral to Ortho pending  Patient reviewed in [place] controlled database, no flags noted. Contract and drug screen are up to date.  Health Maintenance reviewed Diet and exercise encouraged  Follow up plan: 6 months   Jannifer Rodney, [name]

## 2023-06-29 NOTE — Patient Instructions (Signed)
Shoulder Pain Many things can cause shoulder pain, including: An injury to the shoulder. Overuse of the shoulder. Arthritis. The source of the pain can be: Inflammation. An injury to the shoulder joint. An injury to a tendon, ligament, or bone. Follow these instructions at home: Pay attention to changes in your symptoms. Let your health care provider know about them. Follow these instructions to relieve your pain. If you have a removable sling: Wear the sling as told by your provider. Remove it only as told by your provider. Check the skin around the sling every day. Tell your provider about any concerns. Loosen the sling if your fingers tingle, become numb, or become cold. Keep the sling clean. If the sling is not waterproof: Do not let it get wet. Remove it to shower or bathe. Move your arm as little as possible, but keep your hand moving to prevent swelling. Managing pain, stiffness, and swelling  If told, put ice on the painful area. If you have a removable sling or immobilizer, remove it as told by your provider. Put ice in a plastic bag. Place a towel between your skin and the bag. Leave the ice on for 20 minutes, 2-3 times a day. If your skin turns bright red, remove the ice right away to prevent skin damage. The risk of damage is higher if you cannot feel pain, heat, or cold. Move your fingers often to reduce stiffness and swelling. Squeeze a soft ball or a foam pad as much as possible. This helps to keep the shoulder from swelling. It also helps to strengthen the arm. General instructions Take over-the-counter and prescription medicines only as told by your provider. Exercise may help with pain management. Perform exercises if told by your provider. You may be referred to a physical therapist to help in your recovery process. Keep all follow-up visits in order to avoid any type of permanent shoulder disability or chronic pain problems. Contact a health care provider  if: Your pain is not relieved with medicines. New pain develops in your arm, hand, or fingers. You loosen your sling and your arm, hand, or fingers remain tingly, numb, swollen, or painful. Get help right away if: Your arm, hand, or fingers turn white or blue. This information is not intended to replace advice given to you by your health care provider. Make sure you discuss any questions you have with your health care provider. Document Revised: 10/25/2021 Document Reviewed: 10/25/2021 Elsevier Patient Education  2024 [employer]  

## 2023-07-01 LAB — TOXASSURE SELECT 13 (MW), URINE

## 2023-08-26 ENCOUNTER — Encounter: Payer: Self-pay | Admitting: Orthopedic Surgery

## 2023-08-26 ENCOUNTER — Other Ambulatory Visit (INDEPENDENT_AMBULATORY_CARE_PROVIDER_SITE_OTHER): Payer: Self-pay

## 2023-08-26 ENCOUNTER — Ambulatory Visit: Admitting: Orthopedic Surgery

## 2023-08-26 DIAGNOSIS — M25511 Pain in right shoulder: Secondary | ICD-10-CM

## 2023-08-26 DIAGNOSIS — G8929 Other chronic pain: Secondary | ICD-10-CM

## 2023-08-26 NOTE — Patient Instructions (Signed)

## 2023-08-26 NOTE — Addendum Note (Signed)
 Addended by: Marti Slates on: 08/26/2023 04:53 PM   Modules accepted: Orders

## 2023-08-26 NOTE — Progress Notes (Signed)
 New Patient Visit  Assessment: Benjamin Edwards is a 58 y.o. male with the following: 1. Chronic right shoulder pain; rotator cuff injury versus SLAP tear   Plan: Benjamin Edwards has chronic right shoulder pain.  His pain is getting worse.  It is affecting his ability to complete tasks at work.  He states he is losing his strength.  On physical exam, he has a positive Jobes, as well as positive O'Brien's.  Concern for possible rotator cuff injury versus a SLAP tear.  He has tried medications.  He has had multiple injections.  Based on his limited progression, and weakness affecting his ability to complete tasks at work, I have recommended an MRI.  Once the MRI is completed, he will return to clinic to discuss the findings.  Follow-up: Return for After MRI.  Subjective:  Chief Complaint  Patient presents with   Shoulder Pain    R for a couple yrs getting worse. Loss of strength w/ lifting and pain will wake him up out of his sleep. Pt states he had a cortisone shot into the shoulder 06/29/23    History of Present Illness: Benjamin Edwards is a 58 y.o. male who has been referred by Tommas Fragmin, [name] for evaluation of right shoulder pain.  He is right-hand dominant.  He has had pain in the anterior aspect of the right shoulder for couple years.  Pain has been intermittent.  For the past few months, the pain has been progressively worsening.  He is concerned about his strength.  It makes it difficult to complete tasks at work.  Medications have not been effective.  He remains active at work.  He has had multiple injections, which have not provided sustained relief.  Pain gets worse at night.  He has difficulty with overhead motion.   Review of Systems: No fevers or chills No numbness or tingling No chest pain No shortness of breath No bowel or bladder dysfunction No GI distress No headaches   Medical History:  Past Medical History:  Diagnosis Date   Anxiety    History of atrial  fibrillation    Documented age 77   Hyperlipidemia    Hypertension    Seasonal allergies    Spring / Fall    Past Surgical History:  Procedure Laterality Date   KNEE SURGERY Right 2020   meniscus torn, repaired   Skin graph left leg Right 1978   WISDOM TOOTH EXTRACTION      Family History  Problem Relation Age of Onset   Heart disease Father    Hyperlipidemia Father    Hypertension Father    Pancreatic cancer Father        started in pancreas, mets to stomach    Colon cancer Other 92   Esophageal cancer Paternal Aunt    Colon polyps Neg Hx    Rectal cancer Neg Hx    Stomach cancer Neg Hx    Social History   Tobacco Use   Smoking status: Never   Smokeless tobacco: Former    Types: Snuff    Quit date: 04/07/2021  Vaping Use   Vaping status: Never Used  Substance Use Topics   Alcohol use: Not Currently   Drug use: No    Allergies  Allergen Reactions   Penicillins     Current Meds  Medication Sig   ALPRAZolam  (XANAX ) 0.25 MG tablet Take 1 tablet (0.25 mg total) by mouth 2 (two) times daily as needed for anxiety.   amLODipine -valsartan  (EXFORGE ) 10-320  MG tablet Take 1 tablet by mouth daily.   aspirin  325 MG EC tablet Take 325 mg by mouth daily.   atorvastatin  (LIPITOR) 20 MG tablet Take 1 tablet (20 mg total) by mouth daily.   colchicine  0.6 MG tablet Take 1.2 mg then 1 hour later 0.6 mg. Max 1.8 mg//24 hours   escitalopram  (LEXAPRO ) 20 MG tablet Take 1 tablet (20 mg total) by mouth daily.   metoprolol  (TOPROL -XL) 200 MG 24 hr tablet Take 1 tablet (200 mg total) by mouth daily.    Objective: There were no vitals taken for this visit.  Physical Exam:  General: Alert and oriented. and No acute distress. Gait: Normal gait.  Of the right shoulder demonstrates no deformity.  No atrophy.  Tenderness palpation over the bicipital groove.  Tenderness palpation over the anterior lateral shoulder.  100 degrees of forward flexion.  Internal rotation to T12.  External  rotation 40 degrees, similar to contralateral side.  Negative belly press.  Positive Jobe's.  Positive empty can testing.  Positive O'Brien's.  Fingers warm and well-perfused.  IMAGING: I personally ordered and reviewed the following images   X-rays of the right shoulder were obtained in clinic today.  No acute injuries noted.  Mild loss of glenohumeral joint space.  No evidence of proximal humeral migration.  No bony lesions.  Mild degenerative changes of the [facility] joint.  Impression: Right shoulder x-rays with mild degenerative change.   New Medications:  No orders of the defined types were placed in this encounter.     Tonita Frater, MD  08/26/2023 4:50 PM

## 2023-11-13 ENCOUNTER — Ambulatory Visit: Payer: Self-pay | Admitting: Family

## 2023-11-13 ENCOUNTER — Encounter: Payer: Self-pay | Admitting: Family

## 2023-11-13 VITALS — BP 120/73 | HR 60 | Temp 98.5°F | Ht 70.0 in | Wt 234.0 lb

## 2023-11-13 DIAGNOSIS — Z0000 Encounter for general adult medical examination without abnormal findings: Secondary | ICD-10-CM

## 2023-11-13 DIAGNOSIS — I10 Essential (primary) hypertension: Secondary | ICD-10-CM

## 2023-11-13 DIAGNOSIS — F411 Generalized anxiety disorder: Secondary | ICD-10-CM

## 2023-11-13 DIAGNOSIS — Z0001 Encounter for general adult medical examination with abnormal findings: Secondary | ICD-10-CM

## 2023-11-13 DIAGNOSIS — H348112 Central retinal vein occlusion, right eye, stable: Secondary | ICD-10-CM

## 2023-11-13 DIAGNOSIS — E785 Hyperlipidemia, unspecified: Secondary | ICD-10-CM

## 2023-11-13 DIAGNOSIS — K219 Gastro-esophageal reflux disease without esophagitis: Secondary | ICD-10-CM

## 2023-11-13 DIAGNOSIS — E669 Obesity, unspecified: Secondary | ICD-10-CM

## 2023-11-13 DIAGNOSIS — M1990 Unspecified osteoarthritis, unspecified site: Secondary | ICD-10-CM

## 2023-11-13 DIAGNOSIS — L0291 Cutaneous abscess, unspecified: Secondary | ICD-10-CM | POA: Diagnosis not present

## 2023-11-13 LAB — LIPID PANEL

## 2023-11-13 MED ORDER — SULFAMETHOXAZOLE-TRIMETHOPRIM 800-160 MG PO TABS: 1.0000 | ORAL_TABLET | Freq: Two times a day (BID) | ORAL | 0 refills | Status: DC

## 2023-11-13 NOTE — Progress Notes (Signed)
 Subjective:    Patient ID: Benjamin Edwards, male    DOB: 03-02-1966, 58 y.o.   MRN: [id number]  Chief Complaint  Patient presents with   Insect Bite   PT presents to the office today for CPE and with an erythemas, hard area on left abdomen that he noticed 3 days ago. Reports it has been unchanged. Does not remember any insect bite. Denies any itching, fevers. Reports intermittent burning pain 8 out 10 when the area is touched or his clothing rubs against.   Has been putting neosporin and washing with cold soapy water with no relief.     Pt is obese with a BMI of 33 and HTN and hyperlipidemia.     He has CRVO and getting monthly injections in his retina. This is causing a lot of anxiety for him as he can not put eye drops in his eye. Reports the xanax  as needed has greatly helped.  Hypertension This is a chronic problem. The current episode started more than 1 year ago. The problem has been resolved since onset. The problem is controlled. Associated symptoms include anxiety and malaise/fatigue. Pertinent negatives include no peripheral edema or shortness of breath. Risk factors for coronary artery disease include obesity and male gender. The current treatment provides moderate improvement.  Gastroesophageal Reflux He complains of belching and heartburn. This is a chronic problem. The current episode started more than 1 year ago. The problem occurs occasionally. The symptoms are aggravated by certain foods. Risk factors include obesity. He has tried a diet change for the symptoms.  Arthritis Presents for follow-up visit. He complains of pain and stiffness. Affected locations include the left knee, right knee and right shoulder. His pain is at a severity of 6/10.  Hyperlipidemia This is a chronic problem. The current episode started more than 1 year ago. The problem is controlled. Recent lipid tests were reviewed and are normal. Exacerbating diseases include obesity. Pertinent negatives include  no shortness of breath. Current antihyperlipidemic treatment includes statins. The current treatment provides moderate improvement of lipids. Risk factors for coronary artery disease include male sex, dyslipidemia, hypertension and a sedentary lifestyle.  Anxiety Presents for follow-up visit. Symptoms include excessive worry and nervous/anxious behavior. Patient reports no shortness of breath. Symptoms occur occasionally. The severity of symptoms is moderate.        Review of Systems  Constitutional:  Positive for malaise/fatigue.  Respiratory:  Negative for shortness of breath.   Gastrointestinal:  Positive for heartburn.  Musculoskeletal:  Positive for arthritis and stiffness.  Psychiatric/Behavioral:  The patient is nervous/anxious.   All other systems reviewed and are negative.   Social History   Socioeconomic History   Marital status: Married    Spouse name: Not on file   Number of children: Not on file   Years of education: Not on file   Highest education level: Not on file  Occupational History   Not on file  Tobacco Use   Smoking status: Never   Smokeless tobacco: Former    Types: Snuff    Quit date: 04/07/2021  Vaping Use   Vaping status: Never Used  Substance and Sexual Activity   Alcohol use: Not Currently   Drug use: No   Sexual activity: Yes  Other Topics Concern   Not on file  Social History Narrative   Not on file   Social Drivers of Health   Financial Resource Strain: Not on file  Food Insecurity: No Food Insecurity (11/13/2023)   Hunger Vital  Sign    Worried About Programme researcher, broadcasting/film/video in the Last Year: Never true    Ran Out of Food in the Last Year: Never true  Transportation Needs: No Transportation Needs (11/13/2023)   PRAPARE - Administrator, Civil Service (Medical): No    Lack of Transportation (Non-Medical): No  Physical Activity: Not on file  Stress: Not on file  Social Connections: Not on file   Family History  Problem Relation  Age of Onset   Heart disease Father    Hyperlipidemia Father    Hypertension Father    Pancreatic cancer Father        started in pancreas, mets to stomach    Colon cancer Other 92   Esophageal cancer Paternal Aunt    Colon polyps Neg Hx    Rectal cancer Neg Hx    Stomach cancer Neg Hx         Objective:   Physical Exam Vitals reviewed.  Constitutional:      General: He is not in acute distress.    Appearance: He is well-developed. He is obese.  HENT:     Head: Normocephalic.     Right Ear: Tympanic membrane normal.     Left Ear: Tympanic membrane normal.  Eyes:     General:        Right eye: No discharge.        Left eye: No discharge.     Pupils: Pupils are equal, round, and reactive to light.  Neck:     Thyroid : No thyromegaly.  Cardiovascular:     Rate and Rhythm: Normal rate and regular rhythm.     Heart sounds: Normal heart sounds. No murmur heard. Pulmonary:     Effort: Pulmonary effort is normal. No respiratory distress.     Breath sounds: Normal breath sounds. No wheezing.  Abdominal:     General: Bowel sounds are normal. There is no distension.     Palpations: Abdomen is soft.     Tenderness: There is no abdominal tenderness.  Musculoskeletal:        General: No tenderness. Normal range of motion.     Cervical back: Normal range of motion and neck supple.  Skin:    General: Skin is warm and dry.     Findings: Abscess present. No erythema or rash.         Comments: Hard erythemas abscess apporx 3X3.5 cm   Neurological:     Mental Status: He is alert and oriented to person, place, and time.     Cranial Nerves: No cranial nerve deficit.     Deep Tendon Reflexes: Reflexes are normal and symmetric.  Psychiatric:        Behavior: Behavior normal.        Thought Content: Thought content normal.        Judgment: Judgment normal.       BP 120/73   Pulse 60   Temp 98.5 F (36.9 C)   Ht 5' 10 (1.778 m)   Wt 234 lb (106.1 kg)   SpO2 95%   BMI 33.58  kg/m      Assessment & Plan:  Benjamin Edwards comes in today with chief complaint of Insect Bite   Diagnosis and orders addressed:  1. Abscess Unsure if infected hair follicle vs insect bite Start bactrim  BID  Warm compresses  Area marked, if erythemas spreads through area call office Report any increased redness, fever, pain, or discharge  - sulfamethoxazole -trimethoprim  (  BACTRIM  DS) 800-160 MG tablet; Take 1 tablet by mouth 2 (two) times daily.  Dispense: 14 tablet; Refill: 0 - CMP14+EGFR - CBC with Differential/Platelet  2. Essential hypertension - CMP14+EGFR - CBC with Differential/Platelet - TSH  3. Obesity (BMI 30-39.9) - CMP14+EGFR - CBC with Differential/Platelet  4. GAD (generalized anxiety disorder) - CMP14+EGFR - CBC with Differential/Platelet  5. Arthritis - CMP14+EGFR - CBC with Differential/Platelet  6. Hyperlipidemia, unspecified hyperlipidemia type - CMP14+EGFR - CBC with Differential/Platelet - PSA, total and free - TSH  7. Gastroesophageal reflux disease, unspecified whether esophagitis present - CMP14+EGFR - CBC with Differential/Platelet  8. Annual physical exam (Primary) - CMP14+EGFR - CBC with Differential/Platelet - Lipid panel - PSA, total and free - TSH  9. Central retinal vein occlusion of right eye, unspecified complication status - CMP14+EGFR - CBC with Differential/Platelet   Labs pending Start bactrim  BID, hot compresses. Follow up in next week, report increased redness, pain, swelling, or discharge Continue current medications  Keep follow up with specialists  Health Maintenance reviewed Diet and exercise encouraged  Return in about 6 days (around 11/19/2023), or if symptoms worsen or fail to improve.    Bari Learn, [name]

## 2023-11-13 NOTE — Patient Instructions (Signed)
 Skin Abscess  A skin abscess is an infected area on or under your skin. It contains pus and other material. An abscess may also be called a furuncle, carbuncle, or boil. It is often the result of an infection caused by bacteria. An abscess can occur in or on almost any part of your body. Sometimes, an abscess may break open (rupture) on its own. In most cases, it will keep getting worse unless it is treated. An abscess can cause pain and make you feel ill. An untreated abscess can cause infection to spread to other parts of your body or your bloodstream. The abscess may need to be drained. You may also need to take antibiotics. What are the causes? An abscess occurs when germs, like bacteria, pass through your skin and cause an infection. This may be caused by: A scrape or cut on your skin. A puncture wound through your skin, such as a needle injection or insect bite. Blocked oil or sweat glands. Blocked and infected hair follicles. A fluid-filled sac that forms beneath your skin (sebaceous cyst) and becomes infected. What increases the risk? You may be more likely to develop an abscess if: You have problems with blood circulation, or you have a weak body defense system (immune system). You have diabetes. You have dry and irritated skin. You get injections often or use IV drugs. You have a foreign body in a wound, such as a splinter. You smoke or use tobacco products. What are the signs or symptoms? Symptoms of this condition include: A painful, firm bump under the skin. A bump with pus at the top. This may break through the skin and drain. Other symptoms include: Redness and swelling around the abscess. Warmth or tenderness. Swelling of the lymph nodes (glands) near the abscess. A sore on the skin. How is this diagnosed? This condition may be diagnosed based on a physical exam and your medical history. You may also have tests done, such as: A test of a sample of pus. This may be done  to find what is causing the infection. Blood tests. Imaging tests, such as an ultrasound, CT scan, or MRI. How is this treated? A small abscess that drains on its own may not need to be treated. Treatment for larger abscesses may include: Moist heat or a heat pack applied to the area a few times a day. Incision and drainage. This is a procedure to drain the abscess. Antibiotics. For a severe abscess, you may first get antibiotics through an IV and then change to antibiotics by mouth. Follow these instructions at home: Medicines Take over-the-counter and prescription medicines only as told by your provider. If you were prescribed antibiotics, take them as told by your provider. Do not stop using the antibiotic even if you start to feel better. Abscess care  If you have an abscess that has not drained, apply heat to the affected area. Use the heat source that your provider recommends, such as a moist heat pack or a heating pad. Place a towel between your skin and the heat source. Leave the heat on for 20-30 minutes at a time. If your skin turns bright red, remove the heat right away to prevent burns. The risk of burns is higher if you cannot feel pain, heat, or cold. Follow instructions from your provider about how to take care of your abscess. Make sure you: Cover the abscess with a bandage (dressing). Wash your hands with soap and water for at least 20 seconds before  and after you change the dressing or gauze. If soap and water are not available, use hand sanitizer. Change your dressing or gauze as told by your provider. Check your abscess every day for signs of an infection that is getting worse. Check for: More redness, swelling, pain, or tenderness. More fluid or blood. Warmth. More pus or a worse smell. General instructions To avoid spreading the infection: Do not share personal care items, towels, or hot tubs with others. Avoid making skin contact with other people. Be careful  when getting rid of used dressings, wound packing, or any drainage from the abscess. Do not use any products that contain nicotine or tobacco. These products include cigarettes, chewing tobacco, and vaping devices, such as e-cigarettes. If you need help quitting, ask your provider. Do not use any creams, ointments, or liquids unless you have been told to by your provider. Contact a health care provider if: You see redness that spreads quickly or red streaks on your skin spreading away from the abscess. You have any signs of worse infection at the abscess. You vomit every time you eat or drink. You have a fever, chills, or muscle aches. The cyst or abscess returns. Get help right away if: You have severe pain. You make less pee (urine) than normal. This information is not intended to replace advice given to you by your health care provider. Make sure you discuss any questions you have with your health care provider. Document Revised: 11/06/2021 Document Reviewed: 11/06/2021 Elsevier Patient Education  2024 [employer].

## 2023-11-14 LAB — LIPID PANEL
Chol/HDL Ratio: 3.8 ratio (ref 0.0–5.0)
Cholesterol, Total: 166 mg/dL (ref 100–199)
HDL: 44 mg/dL (ref >39)
LDL Chol Calc (NIH): 104 mg/dL — ABNORMAL HIGH (ref 0–99)
Triglycerides: 100 mg/dL (ref 0–149)
VLDL Cholesterol Cal: 18 mg/dL (ref 5–40)

## 2023-11-14 LAB — CBC WITH DIFFERENTIAL/PLATELET
Basophils Absolute: 0 x10E3/uL (ref 0.0–0.2)
Basos: 0 %
EOS (ABSOLUTE): 0.2 x10E3/uL (ref 0.0–0.4)
Eos: 3 %
Hematocrit: 43.7 % (ref 37.5–51.0)
Hemoglobin: 14.5 g/dL (ref 13.0–17.7)
Immature Grans (Abs): 0 x10E3/uL (ref 0.0–0.1)
Immature Granulocytes: 0 %
Lymphocytes Absolute: 1.6 x10E3/uL (ref 0.7–3.1)
Lymphs: 24 %
MCH: 27.8 pg (ref 26.6–33.0)
MCHC: 33.2 g/dL (ref 31.5–35.7)
MCV: 84 fL (ref 79–97)
Monocytes Absolute: 0.5 x10E3/uL (ref 0.1–0.9)
Monocytes: 7 %
Neutrophils Absolute: 4.6 x10E3/uL (ref 1.4–7.0)
Neutrophils: 66 %
Platelets: 180 x10E3/uL (ref 150–450)
RBC: 5.22 x10E6/uL (ref 4.14–5.80)
RDW: 13.8 % (ref 11.6–15.4)
WBC: 6.9 x10E3/uL (ref 3.4–10.8)

## 2023-11-14 LAB — CMP14+EGFR
ALT: 18 IU/L (ref 0–44)
AST: 17 IU/L (ref 0–40)
Albumin: 4.4 g/dL (ref 3.8–4.9)
Alkaline Phosphatase: 88 IU/L (ref 44–121)
BUN/Creatinine Ratio: 12 (ref 9–20)
BUN: 12 mg/dL (ref 6–24)
Bilirubin Total: 0.5 mg/dL (ref 0.0–1.2)
CO2: 23 mmol/L (ref 20–29)
Calcium: 9.4 mg/dL (ref 8.7–10.2)
Chloride: 101 mmol/L (ref 96–106)
Creatinine, Ser: 0.99 mg/dL (ref 0.76–1.27)
Globulin, Total: 2.8 g/dL (ref 1.5–4.5)
Glucose: 91 mg/dL (ref 70–99)
Potassium: 4.3 mmol/L (ref 3.5–5.2)
Sodium: 140 mmol/L (ref 134–144)
Total Protein: 7.2 g/dL (ref 6.0–8.5)
eGFR: 88 mL/min/1.73 (ref >59)

## 2023-11-14 LAB — PSA, TOTAL AND FREE
PSA, Free Pct: 30 %
PSA, Free: 0.15 ng/mL
Prostate Specific Ag, Serum: 0.5 ng/mL (ref 0.0–4.0)

## 2023-11-14 LAB — TSH: TSH: 1.83 u[IU]/mL (ref 0.450–4.500)

## 2023-11-16 ENCOUNTER — Ambulatory Visit (INDEPENDENT_AMBULATORY_CARE_PROVIDER_SITE_OTHER): Admitting: Family

## 2023-11-16 ENCOUNTER — Other Ambulatory Visit: Payer: Self-pay | Admitting: Family

## 2023-11-16 ENCOUNTER — Encounter: Payer: Self-pay | Admitting: Family

## 2023-11-16 ENCOUNTER — Ambulatory Visit: Payer: Self-pay | Admitting: *Deleted

## 2023-11-16 ENCOUNTER — Telehealth: Admitting: Family

## 2023-11-16 ENCOUNTER — Ambulatory Visit: Payer: Self-pay | Admitting: Family

## 2023-11-16 VITALS — BP 115/77 | HR 61 | Temp 97.8°F | Wt 232.0 lb

## 2023-11-16 DIAGNOSIS — L0291 Cutaneous abscess, unspecified: Secondary | ICD-10-CM | POA: Diagnosis not present

## 2023-11-16 MED ORDER — DOXYCYCLINE HYCLATE 100 MG PO TABS: 100.0000 mg | ORAL_TABLET | Freq: Two times a day (BID) | ORAL | 0 refills | Status: DC

## 2023-11-16 MED ORDER — CEFTRIAXONE SODIUM 1 G IJ SOLR
1.0000 g | Freq: Once | INTRAMUSCULAR | Status: AC
  Administered 2023-11-16: 1 g via INTRAMUSCULAR

## 2023-11-16 MED ORDER — ATORVASTATIN CALCIUM 40 MG PO TABS: 40.0000 mg | ORAL_TABLET | Freq: Every day | ORAL | 4 refills | Status: AC

## 2023-11-16 NOTE — Progress Notes (Signed)
 Subjective:    Patient ID: Yu Peggs, male    DOB: 1965/09/07, 58 y.o.   MRN: [id number]  Chief Complaint  Patient presents with   Recurrent Skin Infections   Pt presents to the office today to recheck abscess. He was seen on 11/13/23 and was given Bactrim  BID and warm compresses QID. Reports the abscess has worsen and the erythemas has spread past the area that was marked on Friday.   He reports he now has an abscess left inner thigh, right anterior thigh, and left anterior thigh.   Denies any fever or discharge. Reports his pain can come and go but can be a 10 out 10 when it hits him.  HPI    Review of Systems  Skin:  Positive for wound.  All other systems reviewed and are negative.   Social History   Socioeconomic History   Marital status: Married    Spouse name: Not on file   Number of children: Not on file   Years of education: Not on file   Highest education level: Not on file  Occupational History   Not on file  Tobacco Use   Smoking status: Never   Smokeless tobacco: Former    Types: Snuff    Quit date: 04/07/2021  Vaping Use   Vaping status: Never Used  Substance and Sexual Activity   Alcohol use: Not Currently   Drug use: No   Sexual activity: Yes  Other Topics Concern   Not on file  Social History Narrative   Not on file   Social Drivers of Health   Financial Resource Strain: Not on file  Food Insecurity: No Food Insecurity (11/13/2023)   Hunger Vital Sign    Worried About Running Out of Food in the Last Year: Never true    Ran Out of Food in the Last Year: Never true  Transportation Needs: No Transportation Needs (11/13/2023)   PRAPARE - Administrator, Civil Service (Medical): No    Lack of Transportation (Non-Medical): No  Physical Activity: Not on file  Stress: Not on file  Social Connections: Not on file   Family History  Problem Relation Age of Onset   Heart disease Father    Hyperlipidemia Father    Hypertension Father     Pancreatic cancer Father        started in pancreas, mets to stomach    Colon cancer Other 92   Esophageal cancer Paternal Aunt    Colon polyps Neg Hx    Rectal cancer Neg Hx    Stomach cancer Neg Hx         Objective:   Physical Exam Vitals reviewed.  Constitutional:      General: He is not in acute distress.    Appearance: He is well-developed.  HENT:     Head: Normocephalic.  Eyes:     General:        Right eye: No discharge.        Left eye: No discharge.     Pupils: Pupils are equal, round, and reactive to light.  Neck:     Thyroid : No thyromegaly.  Cardiovascular:     Rate and Rhythm: Normal rate and regular rhythm.     Heart sounds: Normal heart sounds. No murmur heard. Pulmonary:     Effort: Pulmonary effort is normal. No respiratory distress.     Breath sounds: Normal breath sounds. No wheezing.  Abdominal:     General: Bowel sounds are  normal. There is no distension.     Palpations: Abdomen is soft.     Tenderness: There is no abdominal tenderness.  Musculoskeletal:        General: No tenderness. Normal range of motion.     Cervical back: Normal range of motion and neck supple.  Skin:    General: Skin is warm and dry.     Findings: No erythema or rash.         Comments: Abscess on left lower abdomen, erythemas , hard, tender, approx 7X5xcm  Neurological:     Mental Status: He is alert and oriented to person, place, and time.     Cranial Nerves: No cranial nerve deficit.     Deep Tendon Reflexes: Reflexes are normal and symmetric.  Psychiatric:        Behavior: Behavior normal.        Thought Content: Thought content normal.        Judgment: Judgment normal.   Verbal consent given by patient. Local anesthesia Lidocaine  2% with 5ml Betadine prep Incision made Sanguineous discharge with a slight purulent discharge. Area very hard and tender.  Dressing applied     BP 115/77   Pulse 61   Temp 97.8 F (36.6 C)   Wt 232 lb (105.2 kg)   BMI  33.29 kg/m      Assessment & Plan:  Benjamin Edwards comes in today with chief complaint of Recurrent Skin Infections   Diagnosis and orders addressed:  1. Abscess (Primary) Rocephin  given today Stop bactrim  and start doxycycline  Culture pending I&D, small amount of drainage. Dressing placed.  Hot compresses, continue to let it drain Report increase redness, warmth, fever, or pain  - cefTRIAXone  (ROCEPHIN ) injection 1 g - Anaerobic and Aerobic Culture - doxycycline  (VIBRA -TABS) 100 MG tablet; Take 1 tablet (100 mg total) by mouth 2 (two) times daily.  Dispense: 20 tablet; Refill: 0    Bari Learn, [name]

## 2023-11-16 NOTE — Telephone Encounter (Signed)
 Called patient appt made

## 2023-11-16 NOTE — Telephone Encounter (Signed)
Called patient appt made

## 2023-11-16 NOTE — Telephone Encounter (Signed)
 FYI Only or Action Required?: Action required by provider: clinical question for provider and update on patient condition.  Patient was last seen in primary care on 11/13/2023 by Lavell Bari LABOR, [name].  Called Nurse Triage reporting [place].  Symptoms began today.  Interventions attempted: Prescription medications: bactrim  , warm compresses.  Symptoms are: gradually worsening.  Triage Disposition: See Physician Within 24 Hours  Patient/caregiver understands and will follow disposition?: No, wishes to speak with PCP               Copied from CRM #[id number]. Topic: Clinical - Red Word Triage >> Nov 16, 2023 12:06 PM Antwanette L wrote: Red Word that prompted transfer to Nurse Triage: Pt was bitten by an insect on his stomach. The bite is causing a burning sensation and its draining some blood. Pt saw Bari Lavell on 11/13/23 Reason for Disposition  2 or more boils  Answer Assessment - Initial Assessment Questions Last OV 11/13/23. Wound to stomach continues to drain bloody drainage at times with pain worsening with movement. Now another area or lump, bump, possible cyst to back of left thigh. Taking antibiotics and applying warm compresses as directed. Requesting if any thing else can be done. Please advise and see my chart message from this am.     1. APPEARANCE of BOIL: What does the boil look like?      Red skin , scab in middle  2. LOCATION: Where is the boil located?      Left leg thigh area back of leg 3. NUMBER: How many boils are there?      One  4. SIZE: How big is the boil? (e.g., inches, cm; compare to size of a coin or other object)     Nickel size 5. ONSET: When did the boil start?     Notice today  6. PAIN: Is there any pain? If Yes, ask: How bad is the pain?   (Scale 1-10; or mild, moderate, severe)      Yes moderate to severe with movement 7. FEVER: Do you have a fever? If Yes, ask: What is it, how was it measured, and when did it start?       No  8. SOURCE: Have you been around anyone with boils or other Staph infections? Have you ever had boils before?     Unknown , does have another cyst, possible insect bite or boil to stomach PCP has seen on 11/13/23 9. OTHER SYMPTOMS: Do you have any other symptoms? (e.g., shaking chills, weakness, rash elsewhere on body)     Pain , redness , bleeding drainage at times.  10. PREGNANCY: Is there any chance you are pregnant? When was your last menstrual period?       Na  Protocols used: Boil (Skin Abscess)-A-AH

## 2023-11-16 NOTE — Patient Instructions (Signed)
 Skin Abscess  A skin abscess is an infected area on or under your skin. It contains pus and other material. An abscess may also be called a furuncle, carbuncle, or boil. It is often the result of an infection caused by bacteria. An abscess can occur in or on almost any part of your body. Sometimes, an abscess may break open (rupture) on its own. In most cases, it will keep getting worse unless it is treated. An abscess can cause pain and make you feel ill. An untreated abscess can cause infection to spread to other parts of your body or your bloodstream. The abscess may need to be drained. You may also need to take antibiotics. What are the causes? An abscess occurs when germs, like bacteria, pass through your skin and cause an infection. This may be caused by: A scrape or cut on your skin. A puncture wound through your skin, such as a needle injection or insect bite. Blocked oil or sweat glands. Blocked and infected hair follicles. A fluid-filled sac that forms beneath your skin (sebaceous cyst) and becomes infected. What increases the risk? You may be more likely to develop an abscess if: You have problems with blood circulation, or you have a weak body defense system (immune system). You have diabetes. You have dry and irritated skin. You get injections often or use IV drugs. You have a foreign body in a wound, such as a splinter. You smoke or use tobacco products. What are the signs or symptoms? Symptoms of this condition include: A painful, firm bump under the skin. A bump with pus at the top. This may break through the skin and drain. Other symptoms include: Redness and swelling around the abscess. Warmth or tenderness. Swelling of the lymph nodes (glands) near the abscess. A sore on the skin. How is this diagnosed? This condition may be diagnosed based on a physical exam and your medical history. You may also have tests done, such as: A test of a sample of pus. This may be done  to find what is causing the infection. Blood tests. Imaging tests, such as an ultrasound, CT scan, or MRI. How is this treated? A small abscess that drains on its own may not need to be treated. Treatment for larger abscesses may include: Moist heat or a heat pack applied to the area a few times a day. Incision and drainage. This is a procedure to drain the abscess. Antibiotics. For a severe abscess, you may first get antibiotics through an IV and then change to antibiotics by mouth. Follow these instructions at home: Medicines Take over-the-counter and prescription medicines only as told by your provider. If you were prescribed antibiotics, take them as told by your provider. Do not stop using the antibiotic even if you start to feel better. Abscess care  If you have an abscess that has not drained, apply heat to the affected area. Use the heat source that your provider recommends, such as a moist heat pack or a heating pad. Place a towel between your skin and the heat source. Leave the heat on for 20-30 minutes at a time. If your skin turns bright red, remove the heat right away to prevent burns. The risk of burns is higher if you cannot feel pain, heat, or cold. Follow instructions from your provider about how to take care of your abscess. Make sure you: Cover the abscess with a bandage (dressing). Wash your hands with soap and water for at least 20 seconds before  and after you change the dressing or gauze. If soap and water are not available, use hand sanitizer. Change your dressing or gauze as told by your provider. Check your abscess every day for signs of an infection that is getting worse. Check for: More redness, swelling, pain, or tenderness. More fluid or blood. Warmth. More pus or a worse smell. General instructions To avoid spreading the infection: Do not share personal care items, towels, or hot tubs with others. Avoid making skin contact with other people. Be careful  when getting rid of used dressings, wound packing, or any drainage from the abscess. Do not use any products that contain nicotine or tobacco. These products include cigarettes, chewing tobacco, and vaping devices, such as e-cigarettes. If you need help quitting, ask your provider. Do not use any creams, ointments, or liquids unless you have been told to by your provider. Contact a health care provider if: You see redness that spreads quickly or red streaks on your skin spreading away from the abscess. You have any signs of worse infection at the abscess. You vomit every time you eat or drink. You have a fever, chills, or muscle aches. The cyst or abscess returns. Get help right away if: You have severe pain. You make less pee (urine) than normal. This information is not intended to replace advice given to you by your health care provider. Make sure you discuss any questions you have with your health care provider. Document Revised: 11/06/2021 Document Reviewed: 11/06/2021 Elsevier Patient Education  2024 [employer].

## 2023-11-16 NOTE — Telephone Encounter (Signed)
 Pt called and made an appointment to be seen today.

## 2023-11-19 ENCOUNTER — Ambulatory Visit: Admitting: Family

## 2023-11-19 ENCOUNTER — Encounter: Payer: Self-pay | Admitting: Family

## 2023-11-19 VITALS — BP 121/79 | HR 57 | Temp 97.1°F | Ht 70.0 in | Wt 233.4 lb

## 2023-11-19 DIAGNOSIS — L0291 Cutaneous abscess, unspecified: Secondary | ICD-10-CM

## 2023-11-19 MED ORDER — CEFTRIAXONE SODIUM 1 G IJ SOLR
1.0000 g | Freq: Once | INTRAMUSCULAR | Status: AC
  Administered 2023-11-19: 1 g via INTRAMUSCULAR

## 2023-11-19 NOTE — Patient Instructions (Signed)
 Skin Abscess  A skin abscess is an infected area on or under your skin. It contains pus and other material. An abscess may also be called a furuncle, carbuncle, or boil. It is often the result of an infection caused by bacteria. An abscess can occur in or on almost any part of your body. Sometimes, an abscess may break open (rupture) on its own. In most cases, it will keep getting worse unless it is treated. An abscess can cause pain and make you feel ill. An untreated abscess can cause infection to spread to other parts of your body or your bloodstream. The abscess may need to be drained. You may also need to take antibiotics. What are the causes? An abscess occurs when germs, like bacteria, pass through your skin and cause an infection. This may be caused by: A scrape or cut on your skin. A puncture wound through your skin, such as a needle injection or insect bite. Blocked oil or sweat glands. Blocked and infected hair follicles. A fluid-filled sac that forms beneath your skin (sebaceous cyst) and becomes infected. What increases the risk? You may be more likely to develop an abscess if: You have problems with blood circulation, or you have a weak body defense system (immune system). You have diabetes. You have dry and irritated skin. You get injections often or use IV drugs. You have a foreign body in a wound, such as a splinter. You smoke or use tobacco products. What are the signs or symptoms? Symptoms of this condition include: A painful, firm bump under the skin. A bump with pus at the top. This may break through the skin and drain. Other symptoms include: Redness and swelling around the abscess. Warmth or tenderness. Swelling of the lymph nodes (glands) near the abscess. A sore on the skin. How is this diagnosed? This condition may be diagnosed based on a physical exam and your medical history. You may also have tests done, such as: A test of a sample of pus. This may be done  to find what is causing the infection. Blood tests. Imaging tests, such as an ultrasound, CT scan, or MRI. How is this treated? A small abscess that drains on its own may not need to be treated. Treatment for larger abscesses may include: Moist heat or a heat pack applied to the area a few times a day. Incision and drainage. This is a procedure to drain the abscess. Antibiotics. For a severe abscess, you may first get antibiotics through an IV and then change to antibiotics by mouth. Follow these instructions at home: Medicines Take over-the-counter and prescription medicines only as told by your provider. If you were prescribed antibiotics, take them as told by your provider. Do not stop using the antibiotic even if you start to feel better. Abscess care  If you have an abscess that has not drained, apply heat to the affected area. Use the heat source that your provider recommends, such as a moist heat pack or a heating pad. Place a towel between your skin and the heat source. Leave the heat on for 20-30 minutes at a time. If your skin turns bright red, remove the heat right away to prevent burns. The risk of burns is higher if you cannot feel pain, heat, or cold. Follow instructions from your provider about how to take care of your abscess. Make sure you: Cover the abscess with a bandage (dressing). Wash your hands with soap and water for at least 20 seconds before  and after you change the dressing or gauze. If soap and water are not available, use hand sanitizer. Change your dressing or gauze as told by your provider. Check your abscess every day for signs of an infection that is getting worse. Check for: More redness, swelling, pain, or tenderness. More fluid or blood. Warmth. More pus or a worse smell. General instructions To avoid spreading the infection: Do not share personal care items, towels, or hot tubs with others. Avoid making skin contact with other people. Be careful  when getting rid of used dressings, wound packing, or any drainage from the abscess. Do not use any products that contain nicotine or tobacco. These products include cigarettes, chewing tobacco, and vaping devices, such as e-cigarettes. If you need help quitting, ask your provider. Do not use any creams, ointments, or liquids unless you have been told to by your provider. Contact a health care provider if: You see redness that spreads quickly or red streaks on your skin spreading away from the abscess. You have any signs of worse infection at the abscess. You vomit every time you eat or drink. You have a fever, chills, or muscle aches. The cyst or abscess returns. Get help right away if: You have severe pain. You make less pee (urine) than normal. This information is not intended to replace advice given to you by your health care provider. Make sure you discuss any questions you have with your health care provider. Document Revised: 11/06/2021 Document Reviewed: 11/06/2021 Elsevier Patient Education  2024 [employer].

## 2023-11-19 NOTE — Progress Notes (Signed)
 Subjective:    Patient ID: Benjamin Edwards, male    DOB: 06/14/1965, 58 y.o.   MRN: [id number]  Chief Complaint  Patient presents with   Follow-up   Pt presents to the office today to recheck abscess. He was seen on 11/13/23 and was given Bactrim  BID and warm compresses QID. He was seen on 11/16/23 and given Rocephin  and bactrim  changed to doxycyline. I&D and moderate amount of drainage.   Reports it continues to drain.   He reports he now has an abscess left inner thigh, right anterior thigh, and left anterior thigh.   Denies any fever or discharge. Reports his pain is a 0 out 10 now.  Wound Check He was originally treated 5 to 10 days ago. Previous treatment included oral antibiotics. His temperature was unmeasured prior to arrival. There has been bloody and colored discharge from the wound. The redness has improved. The swelling has improved. The pain has improved.     Review of Systems  Skin:  Positive for wound.  All other systems reviewed and are negative.   Social History   Socioeconomic History   Marital status: Married    Spouse name: Not on file   Number of children: Not on file   Years of education: Not on file   Highest education level: Not on file  Occupational History   Not on file  Tobacco Use   Smoking status: Never   Smokeless tobacco: Former    Types: Snuff    Quit date: 04/07/2021  Vaping Use   Vaping status: Never Used  Substance and Sexual Activity   Alcohol use: Not Currently   Drug use: No   Sexual activity: Yes  Other Topics Concern   Not on file  Social History Narrative   Not on file   Social Drivers of Health   Financial Resource Strain: Not on file  Food Insecurity: No Food Insecurity (11/13/2023)   Hunger Vital Sign    Worried About Running Out of Food in the Last Year: Never true    Ran Out of Food in the Last Year: Never true  Transportation Needs: No Transportation Needs (11/13/2023)   PRAPARE - Scientist, research (physical sciences) (Medical): No    Lack of Transportation (Non-Medical): No  Physical Activity: Not on file  Stress: Not on file  Social Connections: Not on file   Family History  Problem Relation Age of Onset   Heart disease Father    Hyperlipidemia Father    Hypertension Father    Pancreatic cancer Father        started in pancreas, mets to stomach    Colon cancer Other 92   Esophageal cancer Paternal Aunt    Colon polyps Neg Hx    Rectal cancer Neg Hx    Stomach cancer Neg Hx         Objective:   Physical Exam Vitals reviewed.  Constitutional:      General: He is not in acute distress.    Appearance: He is well-developed.  HENT:     Head: Normocephalic.  Eyes:     General:        Right eye: No discharge.        Left eye: No discharge.     Pupils: Pupils are equal, round, and reactive to light.  Neck:     Thyroid : No thyromegaly.  Cardiovascular:     Rate and Rhythm: Normal rate and regular rhythm.  Heart sounds: Normal heart sounds. No murmur heard. Pulmonary:     Effort: Pulmonary effort is normal. No respiratory distress.     Breath sounds: Normal breath sounds. No wheezing.  Abdominal:     General: Bowel sounds are normal. There is no distension.     Palpations: Abdomen is soft.     Tenderness: There is no abdominal tenderness.  Musculoskeletal:        General: No tenderness. Normal range of motion.     Cervical back: Normal range of motion and neck supple.  Skin:    General: Skin is warm and dry.     Findings: No erythema or rash.         Comments: Abscess on left lower abdomen, erythemas , hard, tender, approx 8X3xcm, draining purulent discharge   Neurological:     Mental Status: He is alert and oriented to person, place, and time.     Cranial Nerves: No cranial nerve deficit.     Deep Tendon Reflexes: Reflexes are normal and symmetric.  Psychiatric:        Behavior: Behavior normal.        Thought Content: Thought content normal.        Judgment:  Judgment normal.     BP 121/79   Pulse (!) 57   Temp (!) 97.1 F (36.2 C)   Ht 5' 10 (1.778 m)   Wt 233 lb 6.4 oz (105.9 kg)   BMI 33.49 kg/m      Assessment & Plan:  Wyatte Dames comes in today with chief complaint of Follow-up   Diagnosis and orders addressed:  1. Abscess (Primary) Rocephin  given today Continue doxycycline  Culture still pending Hot compresses, continue to let it drain Report increase redness, warmth, fever, or pain  Follow up in 1 week  - cefTRIAXone  (ROCEPHIN ) injection 1 g - doxycycline  (VIBRA -TABS) 100 MG tablet; Take 1 tablet (100 mg total) by mouth 2 (two) times daily.  Dispense: 20 tablet; Refill: 0    Bari Learn, [name]

## 2023-11-21 LAB — ANAEROBIC AND AEROBIC CULTURE

## 2023-11-23 ENCOUNTER — Ambulatory Visit: Payer: Self-pay | Admitting: Family

## 2023-11-27 ENCOUNTER — Encounter: Payer: Self-pay | Admitting: Family

## 2023-11-27 ENCOUNTER — Ambulatory Visit: Admitting: Family

## 2023-11-27 VITALS — BP 105/72 | HR 59 | Temp 97.7°F | Ht 70.0 in | Wt 234.6 lb

## 2023-11-27 DIAGNOSIS — Z22322 Carrier or suspected carrier of Methicillin resistant Staphylococcus aureus: Secondary | ICD-10-CM | POA: Diagnosis not present

## 2023-11-27 DIAGNOSIS — L0291 Cutaneous abscess, unspecified: Secondary | ICD-10-CM | POA: Diagnosis not present

## 2023-11-27 MED ORDER — DOXYCYCLINE HYCLATE 100 MG PO TABS: 100.0000 mg | ORAL_TABLET | Freq: Two times a day (BID) | ORAL | 0 refills | Status: DC

## 2023-11-27 MED ORDER — MUPIROCIN 2 % EX OINT: 1.0000 | TOPICAL_OINTMENT | Freq: Two times a day (BID) | CUTANEOUS | 0 refills | Status: DC

## 2023-11-27 NOTE — Patient Instructions (Signed)
MRSA Infection, Diagnosis, Adult Methicillin-resistant Staphylococcus aureus (MRSA) infection is caused by bacteria called Staphylococcus aureus, or staph, that no longer respond to common antibiotic medicines (drug-resistant bacteria). MRSA infection can be hard to treat. Most of the time, MRSA can be on the skin or in the nose without causing problems (colonized). However, if MRSA enters the body through a cut, a sore, or an invasive medical device, it can cause a serious infection. What are the causes? This condition is caused by staph bacteria. Illness may develop after exposure to the bacteria through: Skin-to-skin contact with someone who is infected with MRSA. Touching surfaces that have the bacteria on them. Having a procedure or using equipment that allows MRSA to enter the body. Having MRSA that lives on your skin and then enters your body through: A cut or scratch. A surgery or procedure. The use of a medical device. Contact with the bacteria may occur: During a stay in a hospital, rehabilitation facility, nursing home, or other health care facility (health care-associated MRSA). In daily activities where there is close contact with others, such as sports, child care centers, or at home (community-associated MRSA). What increases the risk? You are more likely to develop this condition if you: Have a surgery or procedure. Have an IV or a thin tube (catheter) placed in your body. Are elderly. Are on kidney dialysis. Have recently taken an antibiotic medicine. Live in a long-term care facility. Have a chronic wound or skin ulcer. Have a weak body defense system (immune system). Play sports that involve skin-to-skin contact. Live in a crowded place, like a dormitory or [employer]. Share towels, razors, or sports equipment with other people. Have a history of MRSA infection or colonization. What are the signs or symptoms? Symptoms of this condition depend on the area that  is affected. Symptoms may include: A pus-filled pimple or boil. Pus that drains from your skin. A sore (abscess) under your skin or somewhere in your body. Fever with or without chills. Difficulty breathing. Coughing up blood. Redness, warmth, swelling, or pain in the affected area. How is this diagnosed? This condition may be diagnosed based on: A physical exam. Your medical history. Taking a sample from the infected area and growing it in a lab (culture). You may also have other tests, including: Imaging tests, such as X-rays, a CT scan, or an MRI. Lab tests, such as blood, urine, or phlegm (sputum) tests. You skin or nose may be swabbed when you are admitted to a health care facility for a procedure. This is to screen for MRSA. How is this treated? Treatment depends on the type of MRSA infection you have and how severe, deep, or extensive it is. Treatment may include: Antibiotic medicines. Surgery to drain pus from the infected area. Severe infections may require a hospital stay. Follow these instructions at home: Medicines Take over-the-counter and prescription medicines only as told by your health care provider. If you were prescribed an antibiotic medicine, use it as told by your health care provider. Do not stop using the antibiotic even if you start to feel better. Prevention Follow these instructions to avoid spreading the infection to others: Wash your hands frequently with soap and water. If soap and water are not available, use an alcohol-based hand sanitizer. Avoid close contact with those around you as much as possible. Do not use towels, razors, toothbrushes, bedding, or other items that will be used by others. Wash towels, bedding, and clothes in the washing machine with detergent  and hot water. Dry them in a hot dryer. Clean surfaces regularly to remove germs (disinfection). Use products or solutions that contain bleach. Make sure you disinfect bathroom surfaces, food  preparation areas, exercise equipment, and doorknobs.  General instructions If you have a wound, follow instructions from your health care provider about how to take care of your wound. Do not pick at scabs. Do not try to drain any infection sites or pimples. Tell all your health care providers that you have MRSA, or if you have ever had a MRSA infection. Keep all follow-up visits as told by your health care provider. This is important. Contact a health care provider if you: Do not get better. Have symptoms that get worse. Have new symptoms. Get help right away if you have: Nausea or vomiting, or if you cannot take medicine without vomiting. Trouble breathing. Chest pain. These symptoms may represent a serious problem that is an emergency. Do not wait to see if the symptoms will go away. Get medical help right away. Call your local emergency services (911 in the [place] Do not drive yourself to the hospital. Summary MRSA infection is caused by bacteria called Staphylococcus aureus, or staph, that no longer respond to common antibiotic medicines. Treatment for this condition depends on the type of MRSA infection you have and how severe, deep, and extensive it is. If you were prescribed an antibiotic medicine, use it as told by your health care provider. Do not stop using the antibiotic even if you start to feel better. Follow instructions from your health care provider to avoid spreading the infection to others. This information is not intended to replace advice given to you by your health care provider. Make sure you discuss any questions you have with your health care provider. Document Revised: 02/06/2022 Document Reviewed: 02/06/2022 Elsevier Patient Education  2024 [employer].

## 2023-11-27 NOTE — Progress Notes (Signed)
 Subjective:    Patient ID: Benjamin Edwards, male    DOB: 11/14/1965, 58 y.o.   MRN: [id number]  Chief Complaint  Patient presents with   Wound Check   Pt presents to the office today to recheck abscess. He was seen on 11/13/23 and was given Bactrim  BID and warm compresses QID. He was seen on 11/16/23 and given Rocephin  and bactrim  changed to doxycyline. I&D and moderate amount of drainage.  Seen on 11/19/23 and given Rocephin  injection and told to continue warm compresses.   His wound culture was positive for MRSA. He completed his doxycyline and bactroban  cream.  Denies any fevers, no drainage the last two days. Reports his pain is a 0 out 10 now.  Wound Check He was originally treated 5 to 10 days ago. Previous treatment included oral antibiotics. His temperature was unmeasured prior to arrival. There has been bloody and colored discharge from the wound. The redness has improved. The swelling has improved. The pain has improved.     Review of Systems  Skin:  Positive for wound.  All other systems reviewed and are negative.   Social History   Socioeconomic History   Marital status: Married    Spouse name: Not on file   Number of children: Not on file   Years of education: Not on file   Highest education level: Not on file  Occupational History   Not on file  Tobacco Use   Smoking status: Never   Smokeless tobacco: Former    Types: Snuff    Quit date: 04/07/2021  Vaping Use   Vaping status: Never Used  Substance and Sexual Activity   Alcohol use: Not Currently   Drug use: No   Sexual activity: Yes  Other Topics Concern   Not on file  Social History Narrative   Not on file   Social Drivers of Health   Financial Resource Strain: Not on file  Food Insecurity: No Food Insecurity (11/13/2023)   Hunger Vital Sign    Worried About Running Out of Food in the Last Year: Never true    Ran Out of Food in the Last Year: Never true  Transportation Needs: No Transportation Needs  (11/13/2023)   PRAPARE - Administrator, Civil Service (Medical): No    Lack of Transportation (Non-Medical): No  Physical Activity: Not on file  Stress: Not on file  Social Connections: Not on file   Family History  Problem Relation Age of Onset   Heart disease Father    Hyperlipidemia Father    Hypertension Father    Pancreatic cancer Father        started in pancreas, mets to stomach    Colon cancer Other 92   Esophageal cancer Paternal Aunt    Colon polyps Neg Hx    Rectal cancer Neg Hx    Stomach cancer Neg Hx         Objective:   Physical Exam Vitals reviewed.  Constitutional:      General: He is not in acute distress.    Appearance: He is well-developed.  HENT:     Head: Normocephalic.  Eyes:     General:        Right eye: No discharge.        Left eye: No discharge.     Pupils: Pupils are equal, round, and reactive to light.  Neck:     Thyroid : No thyromegaly.  Cardiovascular:     Rate and Rhythm: Normal  rate and regular rhythm.     Heart sounds: Normal heart sounds. No murmur heard. Pulmonary:     Effort: Pulmonary effort is normal. No respiratory distress.     Breath sounds: Normal breath sounds. No wheezing.  Abdominal:     General: Bowel sounds are normal. There is no distension.     Palpations: Abdomen is soft.     Tenderness: There is no abdominal tenderness.  Musculoskeletal:        General: No tenderness. Normal range of motion.     Cervical back: Normal range of motion and neck supple.  Skin:    General: Skin is warm and dry.     Findings: No erythema or rash.         Comments: Abscess on left lower abdomen, slightly erythemas, healing, approx 4.5X1, slightly hard.   Neurological:     Mental Status: He is alert and oriented to person, place, and time.     Cranial Nerves: No cranial nerve deficit.     Deep Tendon Reflexes: Reflexes are normal and symmetric.  Psychiatric:        Behavior: Behavior normal.        Thought Content:  Thought content normal.        Judgment: Judgment normal.    BP 105/72   Pulse (!) 59   Temp 97.7 F (36.5 C) (Temporal)   Ht 5' 10 (1.778 m)   Wt 234 lb 9.6 oz (106.4 kg)   BMI 33.66 kg/m      Assessment & Plan:  Benjamin Edwards comes in today with chief complaint of Wound Check   Diagnosis and orders addressed:  1. Abscess (Primary)  - mupirocin  ointment (BACTROBAN ) 2 %; Apply 1 Application topically 2 (two) times daily.  Dispense: 22 g; Refill: 0  2. MRSA (methicillin resistant staph aureus) culture positive - mupirocin  ointment (BACTROBAN ) 2 %; Apply 1 Application topically 2 (two) times daily.  Dispense: 22 g; Refill: 0 - doxycycline  (VIBRA -TABS) 100 MG tablet; Take 1 tablet (100 mg total) by mouth 2 (two) times daily.  Dispense: 20 tablet; Refill: 0   Continue warm compresses  Avoid picky or squeezing Bactroban  BID  Will given doxycycline  rx if erythemas or hardest worsen. If it continues to improve, no need to start doxycyline.  Keep chronic follow up and follow if symptoms worsen or do not improve     Bari Learn, [name]

## 2023-12-12 ENCOUNTER — Other Ambulatory Visit: Payer: Self-pay | Admitting: Family

## 2023-12-12 DIAGNOSIS — I10 Essential (primary) hypertension: Secondary | ICD-10-CM

## 2023-12-16 ENCOUNTER — Other Ambulatory Visit: Payer: Self-pay | Admitting: Family

## 2023-12-16 DIAGNOSIS — I10 Essential (primary) hypertension: Secondary | ICD-10-CM

## 2023-12-16 DIAGNOSIS — F411 Generalized anxiety disorder: Secondary | ICD-10-CM

## 2023-12-31 ENCOUNTER — Ambulatory Visit: Admitting: Family

## 2024-01-07 LAB — OPHTHALMOLOGY REPORT-SCANNED

## 2024-01-08 ENCOUNTER — Encounter: Payer: Self-pay | Admitting: Family

## 2024-01-26 ENCOUNTER — Encounter: Payer: Self-pay | Admitting: Family

## 2024-01-26 ENCOUNTER — Ambulatory Visit: Admitting: Family

## 2024-01-26 VITALS — BP 119/80 | HR 59 | Temp 97.5°F | Ht 70.0 in | Wt 241.0 lb

## 2024-01-26 DIAGNOSIS — F411 Generalized anxiety disorder: Secondary | ICD-10-CM | POA: Diagnosis not present

## 2024-01-26 DIAGNOSIS — I10 Essential (primary) hypertension: Secondary | ICD-10-CM

## 2024-01-26 DIAGNOSIS — H348112 Central retinal vein occlusion, right eye, stable: Secondary | ICD-10-CM | POA: Diagnosis not present

## 2024-01-26 DIAGNOSIS — M1A9XX0 Chronic gout, unspecified, without tophus (tophi): Secondary | ICD-10-CM

## 2024-01-26 DIAGNOSIS — Z23 Encounter for immunization: Secondary | ICD-10-CM | POA: Diagnosis not present

## 2024-01-26 MED ORDER — ALPRAZOLAM 0.25 MG PO TABS
0.2500 mg | ORAL_TABLET | Freq: Two times a day (BID) | ORAL | 1 refills | Status: AC | PRN
Start: 2024-01-26 — End: ?

## 2024-01-26 MED ORDER — ESCITALOPRAM OXALATE 20 MG PO TABS: 20.0000 mg | ORAL_TABLET | Freq: Every day | ORAL | 0 refills | Status: AC

## 2024-01-26 MED ORDER — COLCHICINE 0.6 MG PO TABS: ORAL_TABLET | ORAL | 0 refills | Status: AC

## 2024-01-26 MED ORDER — METOPROLOL SUCCINATE ER 200 MG PO TB24: 200.0000 mg | ORAL_TABLET | Freq: Every day | ORAL | 0 refills | Status: AC

## 2024-01-26 MED ORDER — AMLODIPINE BESYLATE-VALSARTAN 10-320 MG PO TABS: 1.0000 | ORAL_TABLET | Freq: Every day | ORAL | 0 refills | Status: AC

## 2024-01-26 NOTE — Progress Notes (Signed)
 Subjective:    Patient ID: Benjamin Edwards, male    DOB: March 12, 1966, 58 y.o.   MRN: [id number]  Chief Complaint  Patient presents with   Medical Management of Chronic Issues   Pt presents to the office today for chronic follow up.   Pt is obese with a BMI of 34 and HTN and hyperlipidemia.     He has CRVO and getting monthly injections in his retina. This is causing a lot of anxiety for him as he can not put eye drops in his eye. Reports the xanax  as needed has greatly helped.   Has hx of gout and takes colchicine  as needed. Last flare up was two weeks ago.  Shoulder Pain  The pain is present in the right shoulder. This is a chronic problem. The current episode started more than 1 year ago. There has been no history of extremity trauma. The problem occurs intermittently. The quality of the pain is described as aching. The pain is at a severity of 7/10. The pain is mild. Associated symptoms include a limited range of motion and stiffness. He has tried rest and acetaminophen for the symptoms. The treatment provided mild relief.  Hypertension This is a chronic problem. The current episode started in the past 7 days. The problem has been resolved since onset. The problem is controlled. Associated symptoms include anxiety and malaise/fatigue. Pertinent negatives include no peripheral edema or shortness of breath. Risk factors for coronary artery disease include male gender and obesity. The current treatment provides moderate improvement.  Gastroesophageal Reflux He complains of belching and heartburn. This is a chronic problem. The current episode started more than 1 year ago. The problem occurs occasionally. The symptoms are aggravated by certain foods. Risk factors include obesity. He has tried a PPI for the symptoms. The treatment provided moderate relief.  Arthritis Presents for follow-up visit. He complains of pain and stiffness. Affected locations include the right shoulder, right knee and  left knee. His pain is at a severity of 6/10.  Hyperlipidemia This is a chronic problem. The current episode started more than 1 year ago. The problem is uncontrolled. Recent lipid tests were reviewed and are normal. Exacerbating diseases include obesity. Pertinent negatives include no shortness of breath. Current antihyperlipidemic treatment includes statins. The current treatment provides moderate improvement of lipids. Risk factors for coronary artery disease include dyslipidemia, hypertension, a sedentary lifestyle, obesity and male sex.  Anxiety Presents for follow-up visit. Symptoms include excessive worry, nervous/anxious behavior and restlessness. Patient reports no shortness of breath. Symptoms occur occasionally. The severity of symptoms is mild.        Review of Systems  Constitutional:  Positive for malaise/fatigue.  Respiratory:  Negative for shortness of breath.   Gastrointestinal:  Positive for heartburn.  Musculoskeletal:  Positive for stiffness.  Psychiatric/Behavioral:  The patient is nervous/anxious.   All other systems reviewed and are negative.  Family History  Problem Relation Age of Onset   Heart disease Father    Hyperlipidemia Father    Hypertension Father    Pancreatic cancer Father        started in pancreas, mets to stomach    Colon cancer Other 92   Esophageal cancer Paternal Aunt    Colon polyps Neg Hx    Rectal cancer Neg Hx    Stomach cancer Neg Hx    Social History   Socioeconomic History   Marital status: Married    Spouse name: Not on file   Number of children:  Not on file   Years of education: Not on file   Highest education level: Not on file  Occupational History   Not on file  Tobacco Use   Smoking status: Never   Smokeless tobacco: Former    Types: Snuff    Quit date: 04/07/2021  Vaping Use   Vaping status: Never Used  Substance and Sexual Activity   Alcohol use: Not Currently   Drug use: No   Sexual activity: Yes  Other  Topics Concern   Not on file  Social History Narrative   Not on file   Social Drivers of Health   Financial Resource Strain: Not on file  Food Insecurity: No Food Insecurity (11/13/2023)   Hunger Vital Sign    Worried About Running Out of Food in the Last Year: Never true    Ran Out of Food in the Last Year: Never true  Transportation Needs: No Transportation Needs (11/13/2023)   PRAPARE - Administrator, Civil Service (Medical): No    Lack of Transportation (Non-Medical): No  Physical Activity: Not on file  Stress: Not on file  Social Connections: Not on file       Objective:   Physical Exam Vitals reviewed.  Constitutional:      General: He is not in acute distress.    Appearance: He is well-developed. He is obese.  HENT:     Head: Normocephalic.     Right Ear: Tympanic membrane normal.     Left Ear: Tympanic membrane normal.  Eyes:     General:        Right eye: No discharge.        Left eye: No discharge.     Pupils: Pupils are equal, round, and reactive to light.  Neck:     Thyroid : No thyromegaly.  Cardiovascular:     Rate and Rhythm: Normal rate and regular rhythm.     Heart sounds: Normal heart sounds. No murmur heard. Pulmonary:     Effort: Pulmonary effort is normal. No respiratory distress.     Breath sounds: Normal breath sounds. No wheezing.  Abdominal:     General: Bowel sounds are normal. There is no distension.     Palpations: Abdomen is soft.     Tenderness: There is no abdominal tenderness.  Musculoskeletal:        General: No tenderness.     Cervical back: Normal range of motion and neck supple.     Comments: Full ROM of right shoulder pain, pain with abduction   Skin:    General: Skin is warm and dry.     Findings: No erythema or rash.  Neurological:     Mental Status: He is alert and oriented to person, place, and time.     Cranial Nerves: No cranial nerve deficit.     Deep Tendon Reflexes: Reflexes are normal and symmetric.   Psychiatric:        Behavior: Behavior normal.        Thought Content: Thought content normal.        Judgment: Judgment normal.      BP 119/80   Pulse (!) 59   Temp (!) 97.5 F (36.4 C) (Temporal)   Ht 5' 10 (1.778 m)   Wt 241 lb (109.3 kg)   BMI 34.58 kg/m      Assessment & Plan:  Benjamin Edwards comes in today with chief complaint of Medical Management of Chronic Issues   Diagnosis and orders addressed:  1. Immunization due - Pneumococcal conjugate vaccine 20-valent (Prevnar 20)  2. Encounter for immunization - Flu vaccine trivalent PF, 6mos and older(Flulaval,Afluria,Fluarix,Fluzone)  3. Essential hypertension (Primary) - metoprolol  (TOPROL -XL) 200 MG 24 hr tablet; Take 1 tablet (200 mg total) by mouth daily.  Dispense: 90 tablet; Refill: 0 - amLODipine -valsartan  (EXFORGE ) 10-320 MG tablet; Take 1 tablet by mouth daily.  Dispense: 90 tablet; Refill: 0  4. GAD (generalized anxiety disorder) - escitalopram  (LEXAPRO ) 20 MG tablet; Take 1 tablet (20 mg total) by mouth daily.  Dispense: 90 tablet; Refill: 0 - ALPRAZolam  (XANAX ) 0.25 MG tablet; Take 1 tablet (0.25 mg total) by mouth 2 (two) times daily as needed for anxiety.  Dispense: 20 tablet; Refill: 1  5. Chronic gout involving toe without tophus, unspecified cause, unspecified laterality  - colchicine  0.6 MG tablet; Take 1.2 mg then 1 hour later 0.6 mg. Max 1.8 mg//24 hours  Dispense: 21 tablet; Refill: 0  6. Central retinal vein occlusion of right eye, unspecified complication status (HCC) - ALPRAZolam  (XANAX ) 0.25 MG tablet; Take 1 tablet (0.25 mg total) by mouth 2 (two) times daily as needed for anxiety.  Dispense: 20 tablet; Refill: 1   Continue current medications  Patient reviewed in  controlled database, no flags noted. Contract and drug screen are up to date.  Health Maintenance reviewed Diet and exercise encouraged  Follow up plan: 6 months   Bari Learn, [name]

## 2024-01-26 NOTE — Patient Instructions (Signed)
Gout  Gout is a condition that causes painful swelling of the joints. Gout is a type of inflammation of the joints (arthritis). This condition is caused by having too much uric acid in the body. Uric acid is a chemical that forms when the body breaks down substances called purines. Purines are important for building body proteins. When the body has too much uric acid, sharp crystals can form and build up inside the joints. This causes pain and swelling. Gout attacks can happen quickly and may be very painful (acute gout). Over time, the attacks can affect more joints and become more frequent (chronic gout). Gout can also cause uric acid to build up under the skin and inside the kidneys. What are the causes? This condition is caused by too much uric acid in your blood. This can happen because: Your kidneys do not remove enough uric acid from your blood. This is the most common cause. Your body makes too much uric acid. This can happen with some cancers and cancer treatments. It can also occur if your body is breaking down too many red blood cells (hemolytic anemia). You eat too many foods that are high in purines. These foods include organ meats and some seafood. Alcohol, especially beer, is also high in purines. A gout attack may be triggered by trauma or stress. What increases the risk? The following factors may make you more likely to develop this condition: Having a family history of gout. Being male and middle-aged. Being male and having gone through menopause. Taking certain medicines, including aspirin, cyclosporine, diuretics, levodopa, and niacin. Having an organ transplant. Having certain conditions, such as: Being obese. Lead poisoning. Kidney disease. A skin condition called psoriasis. Other factors include: Losing weight too quickly. Being dehydrated. Frequently drinking alcohol, especially beer. Frequently drinking beverages that are sweetened with a type of sugar called  fructose. What are the signs or symptoms? An attack of acute gout happens quickly. It usually occurs in just one joint. The most common place is the big toe. Attacks often start at night. Other joints that may be affected include joints of the feet, ankle, knee, fingers, wrist, or elbow. Symptoms of this condition may include: Severe pain. Warmth. Swelling. Stiffness. Tenderness. The affected joint may be very painful to touch. Shiny, red, or purple skin. Chills and fever. Chronic gout may cause symptoms more frequently. More joints may be involved. You may also have white or yellow lumps (tophi) on your hands or feet or in other areas near your joints. How is this diagnosed? This condition is diagnosed based on your symptoms, your medical history, and a physical exam. You may have tests, such as: Blood tests to measure uric acid levels. Removal of joint fluid with a thin needle (aspiration) to look for uric acid crystals. X-rays to look for joint damage. How is this treated? Treatment for this condition has two phases: treating an acute attack and preventing future attacks. Acute gout treatment may include medicines to reduce pain and swelling, including: NSAIDs, such as ibuprofen. Steroids. These are strong anti-inflammatory medicines that can be taken by mouth (orally) or injected into a joint. Colchicine. This medicine relieves pain and swelling when it is taken soon after an attack. It can be given by mouth or through an IV. Preventive treatment may include: Daily use of smaller doses of NSAIDs or colchicine. Use of a medicine that reduces uric acid levels in your blood, such as allopurinol. Changes to your diet. You may need to see   a dietitian about what to eat and drink to prevent gout. Follow these instructions at home: During a gout attack  If directed, put ice on the affected area. To do this: Put ice in a plastic bag. Place a towel between your skin and the bag. Leave the  ice on for 20 minutes, 2-3 times a day. Remove the ice if your skin turns bright red. This is very important. If you cannot feel pain, heat, or cold, you have a greater risk of damage to the area. Raise (elevate) the affected joint above the level of your heart as often as possible. Rest the joint as much as possible. If the affected joint is in your leg, you may be given crutches to use. Follow instructions from your health care provider about eating or drinking restrictions. Avoiding future gout attacks Follow a low-purine diet as told by your dietitian or health care provider. Avoid foods and drinks that are high in purines, including liver, kidney, anchovies, asparagus, herring, mushrooms, mussels, and beer. Maintain a healthy weight or lose weight if you are overweight. If you want to lose weight, talk with your health care provider. Do not lose weight too quickly. Start or maintain an exercise program as told by your health care provider. Eating and drinking Avoid drinking beverages that contain fructose. Drink enough fluids to keep your urine pale yellow. If you drink alcohol: Limit how much you have to: 0-1 drink a day for women who are not pregnant. 0-2 drinks a day for men. Know how much alcohol is in a drink. In the [place] one drink equals one 12 oz bottle of beer (355 mL), one 5 oz glass of wine (148 mL), or one 1 oz glass of hard liquor (44 mL). General instructions Take over-the-counter and prescription medicines only as told by your health care provider. Ask your health care provider if the medicine prescribed to you requires you to avoid driving or using machinery. Return to your normal activities as told by your health care provider. Ask your health care provider what activities are safe for you. Keep all follow-up visits. This is important. Where to find more information [employer]: www.niams.nih.gov Contact a health care provider if you have: Another  gout attack. Continuing symptoms of a gout attack after 10 days of treatment. Side effects from your medicines. Chills or a fever. Burning pain when you urinate. Pain in your lower back or abdomen. Get help right away if you: Have severe or uncontrolled pain. Cannot urinate. Summary Gout is painful swelling of the joints caused by having too much uric acid in the body. The most common site for gout to occur is in the big toe, but it can affect other joints in the body. Medicines and dietary changes can help to prevent and treat gout attacks. This information is not intended to replace advice given to you by your health care provider. Make sure you discuss any questions you have with your health care provider. Document Revised: 12/26/2020 Document Reviewed: 12/26/2020 [employer] Patient Education  2024 [employer]  

## 2024-07-26 ENCOUNTER — Ambulatory Visit: Payer: Self-pay | Admitting: Family
